# Patient Record
Sex: Female | Born: 1945 | Race: White | Hispanic: No | Marital: Married | Smoking: Never smoker
Health system: Southern US, Community
[De-identification: ages and names within clinical notes are randomized; demographics above are authoritative.]

## PROBLEM LIST (undated history)

## (undated) DIAGNOSIS — I77810 Thoracic aortic ectasia: Secondary | ICD-10-CM

## (undated) DIAGNOSIS — K900 Celiac disease: Secondary | ICD-10-CM

## (undated) DIAGNOSIS — I350 Nonrheumatic aortic (valve) stenosis: Secondary | ICD-10-CM

## (undated) DIAGNOSIS — K219 Gastro-esophageal reflux disease without esophagitis: Secondary | ICD-10-CM

## (undated) DIAGNOSIS — I77819 Aortic ectasia, unspecified site: Secondary | ICD-10-CM

## (undated) DIAGNOSIS — M1990 Unspecified osteoarthritis, unspecified site: Secondary | ICD-10-CM

## (undated) DIAGNOSIS — G4733 Obstructive sleep apnea (adult) (pediatric): Secondary | ICD-10-CM

## (undated) DIAGNOSIS — I5189 Other ill-defined heart diseases: Secondary | ICD-10-CM

## (undated) DIAGNOSIS — I10 Essential (primary) hypertension: Secondary | ICD-10-CM

## (undated) DIAGNOSIS — E039 Hypothyroidism, unspecified: Secondary | ICD-10-CM

## (undated) DIAGNOSIS — E785 Hyperlipidemia, unspecified: Secondary | ICD-10-CM

## (undated) HISTORY — DX: Hypothyroidism, unspecified: E03.9

## (undated) HISTORY — DX: Other ill-defined heart diseases: I51.89

## (undated) HISTORY — DX: Essential (primary) hypertension: I10

## (undated) HISTORY — DX: Thoracic aortic ectasia: I77.810

## (undated) HISTORY — DX: Aortic ectasia, unspecified site: I77.819

## (undated) HISTORY — DX: Obstructive sleep apnea (adult) (pediatric): G47.33

## (undated) HISTORY — DX: Nonrheumatic aortic (valve) stenosis: I35.0

## (undated) HISTORY — DX: Hyperlipidemia, unspecified: E78.5

## (undated) HISTORY — PX: OTHER SURGICAL HISTORY: SHX169

---

## 2009-04-03 ENCOUNTER — Emergency Department (HOSPITAL_COMMUNITY): Admission: EM | Admit: 2009-04-03 | Discharge: 2009-04-03 | Payer: Self-pay | Admitting: Family Medicine

## 2012-06-19 ENCOUNTER — Other Ambulatory Visit: Payer: Self-pay | Admitting: Family Medicine

## 2012-06-19 DIAGNOSIS — Z1231 Encounter for screening mammogram for malignant neoplasm of breast: Secondary | ICD-10-CM

## 2012-07-27 ENCOUNTER — Ambulatory Visit
Admission: RE | Admit: 2012-07-27 | Discharge: 2012-07-27 | Disposition: A | Discharge status: Home / Self Care | Payer: Medicare Other | Source: Ambulatory Visit | Attending: Family Medicine | Admitting: Family Medicine

## 2012-07-27 DIAGNOSIS — Z1231 Encounter for screening mammogram for malignant neoplasm of breast: Secondary | ICD-10-CM

## 2013-06-28 ENCOUNTER — Encounter: Payer: Self-pay | Admitting: General Surgery

## 2013-06-28 DIAGNOSIS — R0683 Snoring: Secondary | ICD-10-CM | POA: Insufficient documentation

## 2013-06-28 DIAGNOSIS — E039 Hypothyroidism, unspecified: Secondary | ICD-10-CM

## 2013-06-28 DIAGNOSIS — I10 Essential (primary) hypertension: Secondary | ICD-10-CM | POA: Insufficient documentation

## 2013-07-11 ENCOUNTER — Encounter: Payer: Self-pay | Admitting: Cardiology

## 2013-07-11 ENCOUNTER — Ambulatory Visit (INDEPENDENT_AMBULATORY_CARE_PROVIDER_SITE_OTHER): Payer: Medicare Other | Admitting: Cardiology

## 2013-07-11 VITALS — BP 120/86 | HR 85 | Ht 67.0 in | Wt 248.0 lb

## 2013-07-11 DIAGNOSIS — I10 Essential (primary) hypertension: Secondary | ICD-10-CM

## 2013-07-11 DIAGNOSIS — R0609 Other forms of dyspnea: Secondary | ICD-10-CM

## 2013-07-11 DIAGNOSIS — R0683 Snoring: Secondary | ICD-10-CM

## 2013-07-11 DIAGNOSIS — R0989 Other specified symptoms and signs involving the circulatory and respiratory systems: Secondary | ICD-10-CM

## 2013-07-11 NOTE — Progress Notes (Signed)
[place], [place], [place]  [address] Phone: [phone] Fax:  [phone]  Date:  07/11/2013   ID:  Tina Elliott, DOB 09/06/1945, MRN [id number]  PCP:  Gavin Pound, MD  Sleep Medicine:  Fransico Him, MD    History of Present Illness: Tina Elliott is a 68 y.o. female with a history of HTN who presents today for evaluation of symptoms concerning for OSA.  Apparently she told her PCP that she had been snoring.  Her husband complained to her that she was snoring a lot.  She feels rested when she gets up but has no daytime sleepiness until after dinner.  She goes to bed at 10:30pm and gets up around 7am.  She gets up 1-2 times nightly to go to the bathroom.  She denies any daytime headaches.  She denies any chest pain, SOB, DOE, LE edema, palpitations, dizziness or syncope.     Wt Readings from Last 3 Encounters:  07/11/13 248 lb (112.492 kg)  06/28/13 250 lb 6.4 oz (113.581 kg)     Past Medical History  Diagnosis Date  . Hypertension   . Hyperlipidemia   . Hypothyroidism     Current Outpatient Prescriptions  Medication Sig Dispense Refill  . aspirin 81 MG tablet Take 81 mg by mouth daily.      . Biotin 1 MG CAPS Take 1 tablet by mouth daily.      . calcium carbonate (OS-CAL) 600 MG TABS tablet Take 600 mg by mouth 2 (two) times daily with a meal.      . cholecalciferol (VITAMIN D) 1000 UNITS tablet Take 1,000 Units by mouth daily.      Marland Kitchen levothyroxine (SYNTHROID, LEVOTHROID) 100 MCG tablet Take 100 mcg by mouth daily before breakfast.      . lisinopril-hydrochlorothiazide (PRINZIDE,ZESTORETIC) 10-12.5 MG per tablet Take 1 tablet by mouth daily.      Marland Kitchen loratadine (CLARITIN) 10 MG tablet Take 10 mg by mouth daily.      . Multiple Vitamin (MULTIVITAMIN) tablet Take 1 tablet by mouth daily.      . Omega-3 Fatty Acids (FISH OIL) 1000 MG CAPS Take 2 capsules by mouth daily.       . pravastatin (PRAVACHOL) 40 MG tablet Take 40 mg by mouth daily.      . Verapamil HCl CR  300 MG CP24 Take 1 capsule by mouth daily.      . vitamin C (ASCORBIC ACID) 500 MG tablet Take 1,000 mg by mouth daily.       No current facility-administered medications for this visit.    Allergies:    Allergies  Allergen Reactions  . Wheat Bran Anaphylaxis  . Celebrex [Celecoxib] Other (See Comments)    GI issues  . Gluten Meal Other (See Comments)    Celiac disease    Social History:  The patient  reports that she has never smoked. She does not have any smokeless tobacco history on file. She reports that she does not drink alcohol or use illicit drugs.   Family History:  The patient's family history includes AAA (abdominal aortic aneurysm) in her brother; Heart attack in her father and mother.   ROS:  Please see the history of present illness.      All other systems reviewed and negative.   PHYSICAL EXAM: VS:  BP 120/86  Pulse 85  Ht 5' 7"  (1.702 m)  Wt 248 lb (112.492 kg)  BMI 38.83 kg/m2  SpO2 96% Well nourished, well  developed, in no acute distress HEENT: normal Neck: no JVD Cardiac:  normal S1, S2; RRR; no murmur Lungs:  clear to auscultation bilaterally, no wheezing, rhonchi or rales Abd: soft, nontender, no hepatomegaly Ext: no edema Skin: warm and dry Neuro:  CNs 2-12 intact, no focal abnormalities noted       ASSESSMENT AND PLAN:  1. Snoring with no daytime sleepiness - apparently her snoring is very loud.  - PSG to evaluate for sleep apnea 2. HTN - well controlled  - continue Prinizide/Verapamil  Followup with me PRN    Signed, Fransico Him, MD 07/11/2013 9:49 AM

## 2013-07-11 NOTE — Patient Instructions (Signed)
Your physician recommends that you continue on your current medications as directed. Please refer to the Current Medication list given to you today.  Your physician has recommended that you have a sleep study. This test records several body functions during sleep, including: brain activity, eye movement, oxygen and carbon dioxide blood levels, heart rate and rhythm, breathing rate and rhythm, the flow of air through your mouth and nose, snoring, body muscle movements, and chest and belly movement.([facility])  Your physician recommends that you schedule a follow-up appointment as needed

## 2013-07-23 ENCOUNTER — Telehealth: Payer: Self-pay | Admitting: Cardiology

## 2013-07-23 NOTE — Telephone Encounter (Signed)
Please let patient know that she has mild to moderate OSA with a total AHI of 9.58/hr but during REM sleep it is 30/hr.  Please set up for CPAP titration at [facility]

## 2013-07-24 NOTE — Telephone Encounter (Signed)
Pt is aware. CPAP titration faxed over for pt.

## 2013-07-25 ENCOUNTER — Other Ambulatory Visit: Payer: Self-pay

## 2013-07-25 DIAGNOSIS — Z1231 Encounter for screening mammogram for malignant neoplasm of breast: Secondary | ICD-10-CM

## 2013-08-01 ENCOUNTER — Telehealth: Payer: Self-pay | Admitting: Cardiology

## 2013-08-01 NOTE — Telephone Encounter (Signed)
Pt is scheduled for 10/10/13 at 1:15

## 2013-08-01 NOTE — Telephone Encounter (Signed)
Please let patient know that she had successful CPAP titration to 7cm H2O and set up OV with me in 10 weeks

## 2013-08-02 NOTE — Telephone Encounter (Signed)
Pt is aware.  

## 2013-08-14 ENCOUNTER — Ambulatory Visit
Admission: RE | Admit: 2013-08-14 | Discharge: 2013-08-14 | Disposition: A | Discharge status: Home / Self Care | Payer: Medicare Other | Source: Ambulatory Visit

## 2013-08-14 DIAGNOSIS — Z1231 Encounter for screening mammogram for malignant neoplasm of breast: Secondary | ICD-10-CM

## 2013-08-23 ENCOUNTER — Encounter: Payer: Self-pay | Admitting: Cardiology

## 2013-09-09 ENCOUNTER — Encounter: Payer: Self-pay | Admitting: Cardiology

## 2013-09-26 ENCOUNTER — Encounter: Payer: Self-pay | Admitting: Cardiology

## 2013-09-26 ENCOUNTER — Encounter: Payer: Self-pay | Admitting: General Surgery

## 2013-09-26 ENCOUNTER — Telehealth: Payer: Self-pay | Admitting: Cardiology

## 2013-09-26 NOTE — Telephone Encounter (Signed)
Letter sent to pt to make aware.  

## 2013-09-26 NOTE — Telephone Encounter (Signed)
Please let patient know that CPAP download was normal with good compliance and AHI

## 2013-10-10 ENCOUNTER — Ambulatory Visit (INDEPENDENT_AMBULATORY_CARE_PROVIDER_SITE_OTHER): Payer: Medicare Other | Admitting: Cardiology

## 2013-10-10 ENCOUNTER — Encounter: Payer: Self-pay | Admitting: Cardiology

## 2013-10-10 VITALS — BP 140/80 | HR 76 | Ht 67.0 in | Wt 247.0 lb

## 2013-10-10 DIAGNOSIS — R011 Cardiac murmur, unspecified: Secondary | ICD-10-CM

## 2013-10-10 DIAGNOSIS — G4733 Obstructive sleep apnea (adult) (pediatric): Secondary | ICD-10-CM | POA: Insufficient documentation

## 2013-10-10 DIAGNOSIS — I10 Essential (primary) hypertension: Secondary | ICD-10-CM

## 2013-10-10 NOTE — Patient Instructions (Signed)
Your physician recommends that you continue on your current medications as directed. Please refer to the Current Medication list given to you today.  Your physician has requested that you have an echocardiogram. Echocardiography is a painless test that uses sound waves to create images of your heart. It provides your doctor with information about the size and shape of your heart and how well your heart's chambers and valves are working. This procedure takes approximately one hour. There are no restrictions for this procedure.  Your physician wants you to follow-up in: 6 months with Dr Turner You will receive a reminder letter in the mail two months in advance. If you don't receive a letter, please call our office to schedule the follow-up appointment.  

## 2013-10-10 NOTE — Progress Notes (Signed)
[place], [place], [place]  [address] Phone: [phone] Fax:  [phone]  Date:  10/10/2013   ID:  Tina Elliott, DOB February 19, 1946, MRN [id number]  PCP:  Gavin Pound, MD  Sleep Medicine:  Fransico Him, MD   History of Present Illness: Tina Elliott is a 68 y.o. female with a history of HTN who presented today for evaluation of OSA.  She recently underwent a PSG which showed sleep apnea with an AHI of 9.58/hr and underwent CPAP titration to 7cm H2O.  She is doing well with her CPAP therapy.  She tolerates her nasal mask and feels the pressure is adequate.  She feels rested in the am and has no daytime sleepiness.  Since going on her CPAP she feels better during the day with her energy level.   Wt Readings from Last 3 Encounters:  10/10/13 247 lb (112.038 kg)  07/11/13 248 lb (112.492 kg)  06/28/13 250 lb 6.4 oz (113.581 kg)     Past Medical History  Diagnosis Date  . Hypertension   . Hyperlipidemia   . Hypothyroidism   . OSA (obstructive sleep apnea)     mild with AHI 9.58/hr on CPAP at 7cm H2O    Current Outpatient Prescriptions  Medication Sig Dispense Refill  . aspirin 81 MG tablet Take 81 mg by mouth daily.      . Biotin 1 MG CAPS Take 1 tablet by mouth daily.      . calcium carbonate (OS-CAL) 600 MG TABS tablet Take 600 mg by mouth 2 (two) times daily with a meal.      . cholecalciferol (VITAMIN D) 1000 UNITS tablet Take 1,000 Units by mouth daily.      Marland Kitchen levothyroxine (SYNTHROID, LEVOTHROID) 100 MCG tablet Take 100 mcg by mouth daily before breakfast.      . lisinopril-hydrochlorothiazide (PRINZIDE,ZESTORETIC) 10-12.5 MG per tablet Take 1 tablet by mouth daily.      Marland Kitchen loratadine (CLARITIN) 10 MG tablet Take 10 mg by mouth daily.      . Multiple Vitamin (MULTIVITAMIN) tablet Take 1 tablet by mouth daily.      . Omega-3 Fatty Acids (FISH OIL) 1000 MG CAPS Take 2 capsules by mouth daily.       . pravastatin (PRAVACHOL) 40 MG tablet Take 40 mg by mouth daily.       . Verapamil HCl CR 300 MG CP24 Take 1 capsule by mouth daily.      . vitamin C (ASCORBIC ACID) 500 MG tablet Take 1,000 mg by mouth daily.       No current facility-administered medications for this visit.    Allergies:    Allergies  Allergen Reactions  . Wheat Bran Anaphylaxis  . Celebrex [Celecoxib] Other (See Comments)    GI issues  . Gluten Meal Other (See Comments)    Celiac disease    Social History:  The patient  reports that she has never smoked. She does not have any smokeless tobacco history on file. She reports that she does not drink alcohol or use illicit drugs.   Family History:  The patient's family history includes AAA (abdominal aortic aneurysm) in her brother; Heart attack in her father and mother.   ROS:  Please see the history of present illness.      All other systems reviewed and negative.   PHYSICAL EXAM: VS:  BP 140/80  Pulse 76  Ht 5' 7"  (1.702 m)  Wt 247 lb (112.038 kg)  BMI 38.68  kg/m2 Well nourished, well developed, in no acute distress HEENT: normal Neck: no JVD Cardiac:  normal S1, S2; RRR; 2/6 SM murmur at RUSB Lungs:  clear to auscultation bilaterally, no wheezing, rhonchi or rales Abd: soft, nontender, no hepatomegaly Ext: no edema Skin: warm and dry Neuro:  CNs 2-12 intact, no focal abnormalities noted       ASSESSMENT AND PLAN:  1. OSA on CPAP and tolerating well.  Her last d/l showed an ahi of 1.2/hr and 100% compliance in using more than 4 hours nightly. 2. HTN well controlled - continue Prinizide/Verapamil 3.  Heart murmur -check 2D echo to evaulate  Followup with me in 6 months  Signed, Fransico Him, MD 10/10/2013 1:33 PM

## 2013-10-22 ENCOUNTER — Ambulatory Visit (HOSPITAL_COMMUNITY): Payer: Medicare Other | Attending: Cardiology | Admitting: Radiology

## 2013-10-22 DIAGNOSIS — R011 Cardiac murmur, unspecified: Secondary | ICD-10-CM

## 2013-10-22 NOTE — Progress Notes (Signed)
Echocardiogram Performed. 

## 2014-02-08 ENCOUNTER — Encounter: Payer: Self-pay | Admitting: Cardiology

## 2014-03-11 ENCOUNTER — Ambulatory Visit: Payer: Medicare Other | Admitting: Cardiology

## 2014-04-07 ENCOUNTER — Encounter: Payer: Self-pay | Admitting: Cardiology

## 2014-04-07 ENCOUNTER — Encounter (HOSPITAL_COMMUNITY): Payer: Self-pay | Admitting: Emergency Medicine

## 2014-04-07 ENCOUNTER — Emergency Department (HOSPITAL_COMMUNITY)
Admission: EM | Admit: 2014-04-07 | Discharge: 2014-04-08 | Disposition: A | Discharge status: Home / Self Care | Payer: Medicare Other | Attending: Emergency Medicine | Admitting: Emergency Medicine

## 2014-04-07 ENCOUNTER — Ambulatory Visit (INDEPENDENT_AMBULATORY_CARE_PROVIDER_SITE_OTHER): Payer: Medicare Other | Admitting: Cardiology

## 2014-04-07 VITALS — BP 150/90 | HR 87 | Ht 67.0 in | Wt 248.0 lb

## 2014-04-07 DIAGNOSIS — E785 Hyperlipidemia, unspecified: Secondary | ICD-10-CM | POA: Insufficient documentation

## 2014-04-07 DIAGNOSIS — I10 Essential (primary) hypertension: Secondary | ICD-10-CM

## 2014-04-07 DIAGNOSIS — G4733 Obstructive sleep apnea (adult) (pediatric): Secondary | ICD-10-CM

## 2014-04-07 DIAGNOSIS — Z8719 Personal history of other diseases of the digestive system: Secondary | ICD-10-CM | POA: Insufficient documentation

## 2014-04-07 DIAGNOSIS — E039 Hypothyroidism, unspecified: Secondary | ICD-10-CM | POA: Insufficient documentation

## 2014-04-07 DIAGNOSIS — I5189 Other ill-defined heart diseases: Secondary | ICD-10-CM | POA: Insufficient documentation

## 2014-04-07 DIAGNOSIS — Z7982 Long term (current) use of aspirin: Secondary | ICD-10-CM | POA: Diagnosis not present

## 2014-04-07 DIAGNOSIS — M79605 Pain in left leg: Secondary | ICD-10-CM | POA: Insufficient documentation

## 2014-04-07 DIAGNOSIS — Z79899 Other long term (current) drug therapy: Secondary | ICD-10-CM | POA: Insufficient documentation

## 2014-04-07 DIAGNOSIS — M79662 Pain in left lower leg: Secondary | ICD-10-CM

## 2014-04-07 HISTORY — DX: Celiac disease: K90.0

## 2014-04-07 LAB — COMPREHENSIVE METABOLIC PANEL
ALT: 34 U/L (ref 0–35)
AST: 30 U/L (ref 0–37)
Albumin: 3.6 g/dL (ref 3.5–5.2)
Alkaline Phosphatase: 71 U/L (ref 39–117)
Anion gap: 14 (ref 5–15)
BUN: 22 mg/dL (ref 6–23)
CO2: 23 mEq/L (ref 19–32)
Calcium: 9.7 mg/dL (ref 8.4–10.5)
Chloride: 102 mEq/L (ref 96–112)
Creatinine, Ser: 1.29 mg/dL — ABNORMAL HIGH (ref 0.50–1.10)
GFR calc Af Amer: 47 mL/min — ABNORMAL LOW (ref >90)
GFR calc non Af Amer: 41 mL/min — ABNORMAL LOW (ref >90)
Glucose, Bld: 109 mg/dL — ABNORMAL HIGH (ref 70–99)
Potassium: 4.6 mEq/L (ref 3.7–5.3)
Sodium: 139 mEq/L (ref 137–147)
Total Bilirubin: 1 mg/dL (ref 0.3–1.2)
Total Protein: 7.4 g/dL (ref 6.0–8.3)

## 2014-04-07 LAB — CBC WITH DIFFERENTIAL/PLATELET
Basophils Absolute: 0.1 10*3/uL (ref 0.0–0.1)
Basophils Relative: 1 % (ref 0–1)
Eosinophils Absolute: 0.3 10*3/uL (ref 0.0–0.7)
Eosinophils Relative: 3 % (ref 0–5)
HCT: 45.7 % (ref 36.0–46.0)
Hemoglobin: 15.7 g/dL — ABNORMAL HIGH (ref 12.0–15.0)
Lymphocytes Relative: 25 % (ref 12–46)
Lymphs Abs: 2.4 10*3/uL (ref 0.7–4.0)
MCH: 31.7 pg (ref 26.0–34.0)
MCHC: 34.4 g/dL (ref 30.0–36.0)
MCV: 92.3 fL (ref 78.0–100.0)
Monocytes Absolute: 1 10*3/uL (ref 0.1–1.0)
Monocytes Relative: 10 % (ref 3–12)
Neutro Abs: 5.7 10*3/uL (ref 1.7–7.7)
Neutrophils Relative %: 61 % (ref 43–77)
Platelets: 202 10*3/uL (ref 150–400)
RBC: 4.95 MIL/uL (ref 3.87–5.11)
RDW: 13.8 % (ref 11.5–15.5)
WBC: 9.4 10*3/uL (ref 4.0–10.5)

## 2014-04-07 LAB — PROTIME-INR
INR: 0.91 (ref 0.00–1.49)
Prothrombin Time: 12.3 seconds (ref 11.6–15.2)

## 2014-04-07 LAB — D-DIMER, QUANTITATIVE: D-Dimer, Quant: 3.3 ug/mL-FEU — ABNORMAL HIGH (ref 0.00–0.48)

## 2014-04-07 MED ORDER — ENOXAPARIN SODIUM 120 MG/0.8ML ~~LOC~~ SOLN
110.0000 mg | Freq: Once | SUBCUTANEOUS | Status: AC
  Administered 2014-04-08: 110 mg via SUBCUTANEOUS
  Filled 2014-04-07: qty 2

## 2014-04-07 NOTE — ED Notes (Signed)
Pt. reports left calf pain for 2 days , denies injury or fall , seen [facility] today sent here for further evaluation- ? blood clot . Pedal pulse present / respirations unlabored .

## 2014-04-07 NOTE — Patient Instructions (Signed)
Your physician recommends that you continue on your current medications as directed. Please refer to the Current Medication list given to you today.  Your physician wants you to follow-up in: 6 months with Dr Turner You will receive a reminder letter in the mail two months in advance. If you don't receive a letter, please call our office to schedule the follow-up appointment.  

## 2014-04-07 NOTE — Progress Notes (Signed)
Tina Elliott, [place] Elliott, [place]  [address] Phone: [phone] Fax:  [phone]  Date:  04/07/2014   ID:  Tina Elliott, DOB 1946-03-09, MRN [id number]  PCP:  Gavin Pound, MD  Sleep medicine:  Fransico Him, MD    History of Present Illness: Tina Elliott is a 68 y.o. female with a history of HTN who presented today for followup of OSA. She has mild OSA and is on CPAP at 7cm H2O. She is doing well with her CPAP therapy. She tolerates her nasal mask and feels the pressure is adequate. She feels rested in the am and has no daytime sleepiness. Since going on her CPAP she feels better during the day with her energy level.  She has not had any problems with nasal congestion. She was snoring which resolved with a chin strap.     Wt Readings from Last 3 Encounters:  04/07/14 248 lb (112.492 kg)  10/10/13 247 lb (112.038 kg)  07/11/13 248 lb (112.492 kg)     Past Medical History  Diagnosis Date  . Hypertension   . Hyperlipidemia   . Hypothyroidism   . OSA (obstructive sleep apnea)     mild with AHI 9.58/hr on CPAP at 7cm H2O  . Diastolic dysfunction     Current Outpatient Prescriptions  Medication Sig Dispense Refill  . aspirin 81 MG tablet Take 81 mg by mouth daily.      . Biotin 1 MG CAPS Take 2 tablets by mouth daily.       . calcium carbonate (OS-CAL) 600 MG TABS tablet Take 600 mg by mouth 2 (two) times daily with a meal.      . cholecalciferol (VITAMIN D) 1000 UNITS tablet Take 1,000 Units by mouth daily.      Marland Kitchen levothyroxine (SYNTHROID, LEVOTHROID) 100 MCG tablet Take 100 mcg by mouth daily before breakfast.      . lisinopril-hydrochlorothiazide (PRINZIDE,ZESTORETIC) 10-12.5 MG per tablet Take 1 tablet by mouth daily.      Marland Kitchen loratadine (CLARITIN) 10 MG tablet Take 10 mg by mouth daily.      . Multiple Vitamin (MULTIVITAMIN) tablet Take 1 tablet by mouth daily.      . NON FORMULARY CPAP MACHINE      . Omega-3 Fatty Acids (FISH OIL) 1000 MG CAPS Take 2 capsules by  mouth daily.       . pravastatin (PRAVACHOL) 40 MG tablet Take 40 mg by mouth daily.      . Verapamil HCl CR 300 MG CP24 Take 1 capsule by mouth daily.      . vitamin C (ASCORBIC ACID) 500 MG tablet Take 1,000 mg by mouth daily.       No current facility-administered medications for this visit.    Allergies:    Allergies  Allergen Reactions  . Wheat Bran Anaphylaxis  . Celebrex [Celecoxib] Other (See Comments)    GI issues  . Gluten Meal Other (See Comments)    Celiac disease    Social History:  The patient  reports that she has never smoked. She does not have any smokeless tobacco history on file. She reports that she does not drink alcohol or use illicit drugs.   Family History:  The patient's family history includes AAA (abdominal aortic aneurysm) in her brother; Heart attack in her father and mother.   ROS:  Please see the history of present illness.      All other systems reviewed and negative.   PHYSICAL EXAM: VS:  BP 150/90  Pulse 87  Ht 5' 7"  (1.702 m)  Wt 248 lb (112.492 kg)  BMI 38.83 kg/m2  SpO2 94% Well nourished, well developed, in no acute distress HEENT: normal Neck: no JVD Cardiac:  normal S1, S2; RRR; no murmur Lungs:  clear to auscultation bilaterally, no wheezing, rhonchi or rales Abd: soft, nontender, no hepatomegaly Ext: no edema Skin: warm and dry Neuro:  CNs 2-12 intact, no focal abnormalities noted      ASSESSMENT AND PLAN:  1.  OSA on CPAP and tolerating well.  - Her AHI today is 0.4/hr on 7cm H2O and 100% compliance in using more than 4 hours nightly 2.  HTN mildly elevated but has been having some leg pain and is seeing her PCP this afternoon - continue Prinizide/Verapamil  3.  Obesity - I have encouraged her to try to get more exercise once her leg pain resolves. I have encouraged her to try to increase her walking time to an hour daily.  Followup with me in 6 months   Signed, Fransico Him, MD [facility] 04/07/2014 11:21 AM

## 2014-04-08 ENCOUNTER — Ambulatory Visit (HOSPITAL_COMMUNITY)
Admission: RE | Admit: 2014-04-08 | Discharge: 2014-04-08 | Disposition: A | Discharge status: Home / Self Care | Payer: Medicare Other | Source: Ambulatory Visit | Attending: Emergency Medicine | Admitting: Emergency Medicine

## 2014-04-08 ENCOUNTER — Encounter (HOSPITAL_COMMUNITY): Payer: Self-pay | Admitting: Emergency Medicine

## 2014-04-08 ENCOUNTER — Emergency Department (HOSPITAL_COMMUNITY)
Admission: EM | Admit: 2014-04-08 | Discharge: 2014-04-08 | Disposition: A | Discharge status: Home / Self Care | Payer: Medicare Other | Source: Home / Self Care | Attending: Emergency Medicine | Admitting: Emergency Medicine

## 2014-04-08 DIAGNOSIS — E039 Hypothyroidism, unspecified: Secondary | ICD-10-CM | POA: Insufficient documentation

## 2014-04-08 DIAGNOSIS — Z7902 Long term (current) use of antithrombotics/antiplatelets: Secondary | ICD-10-CM

## 2014-04-08 DIAGNOSIS — G4733 Obstructive sleep apnea (adult) (pediatric): Secondary | ICD-10-CM | POA: Insufficient documentation

## 2014-04-08 DIAGNOSIS — E785 Hyperlipidemia, unspecified: Secondary | ICD-10-CM | POA: Insufficient documentation

## 2014-04-08 DIAGNOSIS — I82442 Acute embolism and thrombosis of left tibial vein: Secondary | ICD-10-CM | POA: Diagnosis not present

## 2014-04-08 DIAGNOSIS — Z8719 Personal history of other diseases of the digestive system: Secondary | ICD-10-CM

## 2014-04-08 DIAGNOSIS — I824Z2 Acute embolism and thrombosis of unspecified deep veins of left distal lower extremity: Secondary | ICD-10-CM | POA: Insufficient documentation

## 2014-04-08 DIAGNOSIS — Z79899 Other long term (current) drug therapy: Secondary | ICD-10-CM

## 2014-04-08 DIAGNOSIS — I82402 Acute embolism and thrombosis of unspecified deep veins of left lower extremity: Secondary | ICD-10-CM

## 2014-04-08 DIAGNOSIS — I10 Essential (primary) hypertension: Secondary | ICD-10-CM | POA: Insufficient documentation

## 2014-04-08 DIAGNOSIS — M79605 Pain in left leg: Secondary | ICD-10-CM | POA: Diagnosis present

## 2014-04-08 DIAGNOSIS — M79609 Pain in unspecified limb: Secondary | ICD-10-CM

## 2014-04-08 DIAGNOSIS — I82492 Acute embolism and thrombosis of other specified deep vein of left lower extremity: Secondary | ICD-10-CM | POA: Insufficient documentation

## 2014-04-08 MED ORDER — ONDANSETRON 4 MG PO TBDP: 4.0000 mg | ORAL_TABLET | ORAL | Status: DC | PRN

## 2014-04-08 MED ORDER — APIXABAN 5 MG PO TABS: 10.0000 mg | ORAL_TABLET | Freq: Two times a day (BID) | ORAL | Status: DC

## 2014-04-08 MED ORDER — DOCUSATE SODIUM 100 MG PO CAPS: 100.0000 mg | ORAL_CAPSULE | Freq: Every day | ORAL | Status: DC

## 2014-04-08 MED ORDER — HYDROCODONE-ACETAMINOPHEN 5-325 MG PO TABS: 1.0000 | ORAL_TABLET | Freq: Four times a day (QID) | ORAL | Status: DC | PRN

## 2014-04-08 MED ORDER — APIXABAN 5 MG PO TABS
10.0000 mg | ORAL_TABLET | Freq: Once | ORAL | Status: AC
  Administered 2014-04-08: 10 mg via ORAL
  Filled 2014-04-08: qty 2

## 2014-04-08 NOTE — ED Provider Notes (Signed)
CSN: [id number]     Arrival date & time 04/07/14  2047 History   First MD Initiated Contact with Patient 04/07/14 2332     Chief Complaint  Patient presents with  . Leg Pain     (Consider location/radiation/quality/duration/timing/severity/associated sxs/prior Treatment) HPI  68 year old female with atraumatic left calf pain. First noticed while she was getting out of bed on Saturday. Pain has been relatively constant since. No appreciable swelling. No rash. No history similar symptoms. Denies any history of DVT/pulmonary embolism. No chest pain or shortness of breath. Reports that she was evaluated by her PCP earlier today and referred to the emergency room for ultrasonography over concern for possible DVT. Labs drawn prior to my evaluation. D-dimer is significantly elevated.  Past Medical History  Diagnosis Date  . Hypertension   . Hyperlipidemia   . Hypothyroidism   . OSA (obstructive sleep apnea)     mild with AHI 9.58/hr on CPAP at 7cm H2O  . Diastolic dysfunction   . Celiac disease    Past Surgical History  Procedure Laterality Date  . Left knee sx     Family History  Problem Relation Age of Onset  . Heart attack Mother   . Heart attack Father   . AAA (abdominal aortic aneurysm) Brother    History  Substance Use Topics  . Smoking status: Never Smoker   . Smokeless tobacco: Not on file  . Alcohol Use: No   OB History   Grav Para Term Preterm Abortions TAB SAB Ect Mult Living                 Review of Systems  All systems reviewed and negative, other than as noted in HPI.   Allergies  Wheat bran; Celebrex; and Gluten meal  Home Medications   Prior to Admission medications   Medication Sig Start Date End Date Taking? Authorizing Provider  aspirin 81 MG tablet Take 81 mg by mouth daily.    Historical Provider, MD  Biotin 1 MG CAPS Take 2 tablets by mouth daily.     Historical Provider, MD  calcium carbonate (OS-CAL) 600 MG TABS tablet Take 600 mg by mouth  2 (two) times daily with a meal.    Historical Provider, MD  cholecalciferol (VITAMIN D) 1000 UNITS tablet Take 1,000 Units by mouth daily.    Historical Provider, MD  levothyroxine (SYNTHROID, LEVOTHROID) 100 MCG tablet Take 100 mcg by mouth daily before breakfast.    Historical Provider, MD  lisinopril-hydrochlorothiazide (PRINZIDE,ZESTORETIC) 10-12.5 MG per tablet Take 1 tablet by mouth daily.    Historical Provider, MD  loratadine (CLARITIN) 10 MG tablet Take 10 mg by mouth daily.    Historical Provider, MD  Multiple Vitamin (MULTIVITAMIN) tablet Take 1 tablet by mouth daily.    Historical Provider, MD  NON FORMULARY CPAP MACHINE    Historical Provider, MD  Omega-3 Fatty Acids (FISH OIL) 1000 MG CAPS Take 2 capsules by mouth daily.     Historical Provider, MD  pravastatin (PRAVACHOL) 40 MG tablet Take 40 mg by mouth daily.    Historical Provider, MD  Verapamil HCl CR 300 MG CP24 Take 1 capsule by mouth daily.    Historical Provider, MD  vitamin C (ASCORBIC ACID) 500 MG tablet Take 1,000 mg by mouth daily.    Historical Provider, MD   BP 137/73  Pulse 97  Temp(Src) 97.9 F (36.6 C) (Oral)  Resp 16  Ht 5' 7"  (1.702 m)  Wt 247 lb 6.4 oz (112.22  kg)  BMI 38.74 kg/m2  SpO2 96% Physical Exam  Nursing note and vitals reviewed. Constitutional: She appears well-developed and well-nourished. No distress.  HENT:  Head: Normocephalic and atraumatic.  Eyes: Conjunctivae are normal. Right eye exhibits no discharge. Left eye exhibits no discharge.  Neck: Neck supple.  Cardiovascular: Normal rate, regular rhythm and normal heart sounds.  Exam reveals no gallop and no friction rub.   No murmur heard. Pulmonary/Chest: Effort normal and breath sounds normal. No respiratory distress.  Abdominal: Soft. She exhibits no distension. There is no tenderness.  Musculoskeletal: She exhibits tenderness. She exhibits no edema.  Tenderness to left calf. Perhaps mild swelling as compared to the right. No  palpable cords. Negative Homans. Easily palpable DP pulse. Neurologically intact distally.  Neurological: She is alert.  Skin: Skin is warm and dry.  Psychiatric: She has a normal mood and affect. Her behavior is normal. Thought content normal.    ED Course  Procedures (including critical care time) Labs Review Labs Reviewed  CBC WITH DIFFERENTIAL - Abnormal; Notable for the following:    Hemoglobin 15.7 (*)    All other components within normal limits  COMPREHENSIVE METABOLIC PANEL - Abnormal; Notable for the following:    Glucose, Bld 109 (*)    Creatinine, Ser 1.29 (*)    GFR calc non Af Amer 41 (*)    GFR calc Af Amer 47 (*)    All other components within normal limits  D-DIMER, QUANTITATIVE - Abnormal; Notable for the following:    D-Dimer, Quant 3.30 (*)    All other components within normal limits  PROTIME-INR    Imaging Review No results found.   EKG Interpretation None      MDM   Final diagnoses:  Calf pain, left    68 year old female with atraumatic left calf pain. I to have concern for possible DVT. No symptoms concerning for PE.  I do not have a good alternative explanation for her symptoms. D-dimer is elevated. Unfortunately, cannot obtain ultrasound at this time of night. Will arrange for her to return tomorrow to obtain this study. Will empirically treat with a dose of Lovenox.    Virgel Manifold, MD 04/08/14 [phone]

## 2014-04-08 NOTE — ED Notes (Signed)
Pt had study completed today which confirmed a LLE DVT.  Pt sent here for treatment.

## 2014-04-08 NOTE — Progress Notes (Signed)
*  Preliminary Results* Left lower extremity venous duplex completed. Left lower extremity is positive for deep vein thrombosis involving the proximal to mid segments of the left posterior tibial and peroneal veins. There is no evidence of left Baker's cyst.  Preliminary results have been discussed with Dr.Nnodi and the patient has been instructed to go to the emergency department for treatment.  04/08/2014 9:00 AM  Gertie FeyMichelle Berenis Corter, RVT, RDCS, RDMS

## 2014-04-08 NOTE — Discharge Instructions (Signed)

## 2014-04-08 NOTE — ED Provider Notes (Signed)
CSN: [id number]     Arrival date & time 04/08/14  0912 History   First MD Initiated Contact with Patient 04/08/14 [phone]     Chief Complaint  Patient presents with  . DVT     (Consider location/radiation/quality/duration/timing/severity/associated sxs/prior Treatment) HPI Patient positive for lower extremity DVT today. Sent to ED for anticoagulation. No associated trauma. Started with calf pain without significant swelling. Patient denies any CP/SOB/LIGHTNEADENESS or syncope. No swelling change since onset, pain persists. No fever/ chills or malaise.   No GI  bleeding history, no CVA or hemorrhagic stroke history. No apparent significant DVT risk factors except obesity with comorbid HTN/dyslipedemia/diastolyic dysfunction.  Past Medical History  Diagnosis Date  . Hypertension   . Hyperlipidemia   . Hypothyroidism   . OSA (obstructive sleep apnea)     mild with AHI 9.58/hr on CPAP at 7cm H2O  . Diastolic dysfunction   . Celiac disease    Past Surgical History  Procedure Laterality Date  . Left knee sx     Family History  Problem Relation Age of Onset  . Heart attack Mother   . Heart attack Father   . AAA (abdominal aortic aneurysm) Brother    History  Substance Use Topics  . Smoking status: Never Smoker   . Smokeless tobacco: Not on file  . Alcohol Use: No   OB History   Grav Para Term Preterm Abortions TAB SAB Ect Mult Living                 Review of Systems  10 Systems reviewed and are negative for acute change except as noted in the HPI.  Allergies  Wheat bran; Celebrex; and Gluten meal  Home Medications   Prior to Admission medications   Medication Sig Start Date End Date Taking? Authorizing Provider  Biotin 1 MG CAPS Take 2 tablets by mouth daily.    Yes Historical Provider, MD  calcium carbonate (OS-CAL) 600 MG TABS tablet Take 600 mg by mouth daily with breakfast.    Yes Historical Provider, MD  cholecalciferol (VITAMIN D) 1000 UNITS tablet Take 1,000  Units by mouth daily.   Yes Historical Provider, MD  levothyroxine (SYNTHROID, LEVOTHROID) 100 MCG tablet Take 100 mcg by mouth daily before breakfast.   Yes Historical Provider, MD  lisinopril-hydrochlorothiazide (PRINZIDE,ZESTORETIC) 10-12.5 MG per tablet Take 1 tablet by mouth daily.   Yes Historical Provider, MD  loratadine (CLARITIN) 10 MG tablet Take 10 mg by mouth daily.   Yes Historical Provider, MD  Multiple Vitamin (MULTIVITAMIN) tablet Take 1 tablet by mouth daily.   Yes Historical Provider, MD  NON FORMULARY CPAP MACHINE   Yes Historical Provider, MD  Omega-3 Fatty Acids (FISH OIL) 1000 MG CAPS Take 2 capsules by mouth daily.    Yes Historical Provider, MD  pravastatin (PRAVACHOL) 40 MG tablet Take 40 mg by mouth daily.   Yes Historical Provider, MD  Verapamil HCl CR 300 MG CP24 Take 300 mg by mouth daily.    Yes Historical Provider, MD  vitamin C (ASCORBIC ACID) 500 MG tablet Take 500 mg by mouth daily.    Yes Historical Provider, MD  apixaban (ELIQUIS) 5 MG TABS tablet Take 2 tablets (10 mg total) by mouth 2 (two) times daily. Take 2 tablets by mouth twice daily for 7 days, then one tablet twice daily. 04/08/14   Charlesetta Shanks, MD  docusate sodium (COLACE) 100 MG capsule Take 1 capsule (100 mg total) by mouth daily. 04/08/14   Charlesetta Shanks,  MD  HYDROcodone-acetaminophen (NORCO/VICODIN) 5-325 MG per tablet Take 1-2 tablets by mouth every 6 (six) hours as needed for moderate pain or severe pain. 04/08/14   Charlesetta Shanks, MD  ondansetron (ZOFRAN ODT) 4 MG disintegrating tablet Take 1 tablet (4 mg total) by mouth every 4 (four) hours as needed for nausea or vomiting. 04/08/14   Charlesetta Shanks, MD   BP 130/61  Pulse 72  Temp(Src) 97.8 F (36.6 C) (Oral)  Resp 16  Ht 5' 7"  (1.702 m)  Wt 247 lb 6 oz (112.209 kg)  BMI 38.74 kg/m2  SpO2 98% Physical Exam  Constitutional: She is oriented to person, place, and time.  Well-appearing 68 year old female no acute distress. Obesity. No  respiratory distress.  HENT:  Head: Normocephalic and atraumatic.  Eyes: EOM are normal.  Cardiovascular: Normal rate, regular rhythm, normal heart sounds and intact distal pulses.   Pulmonary/Chest: Effort normal and breath sounds normal. No respiratory distress. She has no wheezes. She has no rales.  Abdominal: Soft. There is no tenderness.  Musculoskeletal:  Bilateral lower extremities are symmetric in appearance. There is no significant associated edema. Both dorsalis pedis pulses are 2+ and symmetric. Refills brisk less than 2 seconds. Patient significant tenderness to palpation of the calf on the left. No redness or streaking.  Neurological: She is alert and oriented to person, place, and time.  Skin: Skin is warm and dry.  Psychiatric: She has a normal mood and affect.    ED Course  Procedures (including critical care time) Labs Review Labs Reviewed - No data to display  Imaging Review No results found.   EKG Interpretation None      MDM   Final diagnoses:  DVT (deep venous thrombosis), left   Patient presents with confirmed DVT in the posterior tibial and peroneal veins. At this point with distal DVT that is symptomatic with pain but no swelling and no cardiovascular symptoms, limited risk factor for anticoagulation of age and obesity (but not immobilized or limited in capacity to ambulate) patient will be started on outpatient oral anticoagulation with close PCP follow up. Patient counseled to d/c ASA and any NSAID as well as dietary supplements not specifically advised to continue by PCP. Patient counseled on bleeding risks and necessity to return if she should develop any chest pain shortness of breath or other concerning symptoms.    Charlesetta Shanks, MD 04/08/14 1136

## 2014-04-11 ENCOUNTER — Encounter: Payer: Self-pay | Admitting: Cardiology

## 2014-04-11 ENCOUNTER — Ambulatory Visit: Payer: Medicare Other | Admitting: Cardiology

## 2014-07-14 ENCOUNTER — Other Ambulatory Visit: Payer: Self-pay

## 2014-07-14 DIAGNOSIS — Z1231 Encounter for screening mammogram for malignant neoplasm of breast: Secondary | ICD-10-CM

## 2014-07-23 ENCOUNTER — Encounter: Payer: Self-pay | Admitting: Cardiology

## 2014-08-18 ENCOUNTER — Ambulatory Visit: Payer: Medicare Other

## 2014-08-26 ENCOUNTER — Ambulatory Visit
Admission: RE | Admit: 2014-08-26 | Discharge: 2014-08-26 | Disposition: A | Discharge status: Home / Self Care | Payer: Medicare Other | Source: Ambulatory Visit

## 2014-08-26 DIAGNOSIS — Z1231 Encounter for screening mammogram for malignant neoplasm of breast: Secondary | ICD-10-CM

## 2014-10-13 DIAGNOSIS — E669 Obesity, unspecified: Secondary | ICD-10-CM | POA: Insufficient documentation

## 2014-10-13 NOTE — Progress Notes (Signed)
Cardiology Office Note   Date:  10/14/2014   ID:  Tina Elliott, DOB Feb 08, 1946, MRN [id number]  PCP:  Tina Pound, MD    Chief Complaint  Patient presents with  . Sleep Apnea  . Obesity  . Hypertension      History of Present Illness: Tina Elliott is a 69 y.o. female with a history of HTN who presented today for followup of OSA. She has mild OSA and is on CPAP at 7cm H2O. She is doing well with her CPAP therapy. She tolerates her nasal mask and feels the pressure is adequate. She feels rested in the am and has no daytime sleepiness. Since going on her CPAP she feels better during the day with her energy level. She has not had any problems with nasal congestion.  She occasionally has some dryness in her nose.   She was snoring which resolved with a chin strap.      Past Medical History  Diagnosis Date  . Hypertension   . Hyperlipidemia   . Hypothyroidism   . OSA (obstructive sleep apnea)     mild with AHI 9.58/hr on CPAP at 7cm H2O  . Diastolic dysfunction   . Celiac disease     Past Surgical History  Procedure Laterality Date  . Left knee sx       Current Outpatient Prescriptions  Medication Sig Dispense Refill  . BIOTIN 5000 PO Take 2 tablets by mouth daily.    . calcium carbonate (OS-CAL) 600 MG TABS tablet Take 600 mg by mouth daily with breakfast.     . cholecalciferol (VITAMIN D) 1000 UNITS tablet Take 1,000 Units by mouth daily.    Marland Kitchen levothyroxine (SYNTHROID, LEVOTHROID) 100 MCG tablet Take 100 mcg by mouth daily before breakfast.    . lisinopril-hydrochlorothiazide (PRINZIDE,ZESTORETIC) 10-12.5 MG per tablet Take 1 tablet by mouth daily.    Marland Kitchen loratadine (CLARITIN) 10 MG tablet Take 10 mg by mouth daily.    . Multiple Vitamin (MULTIVITAMIN) tablet Take 1 tablet by mouth daily.    . NON FORMULARY CPAP MACHINE    . Omega-3 Fatty Acids (FISH OIL) 1000 MG CAPS Take 1 capsule by mouth daily.     . pravastatin (PRAVACHOL) 40 MG tablet Take 40 mg by mouth  daily.    . Verapamil HCl CR 300 MG CP24 Take 300 mg by mouth daily.     . vitamin C (ASCORBIC ACID) 500 MG tablet Take 500 mg by mouth daily.      No current facility-administered medications for this visit.    Allergies:   Wheat bran; Celebrex; and Gluten meal    Social History:  The patient  reports that she has never smoked. She does not have any smokeless tobacco history on file. She reports that she does not drink alcohol or use illicit drugs.   Family History:  The patient's family history includes AAA (abdominal aortic aneurysm) in her brother; Heart attack in her father and mother.    ROS:  Please see the history of present illness.   Otherwise, review of systems are positive for none.   All other systems are reviewed and negative.    PHYSICAL EXAM: VS:  BP 122/78 mmHg  Pulse 82  Ht 5' 7"  (1.702 m)  Wt 246 lb 6.4 oz (111.766 kg)  BMI 38.58 kg/m2  SpO2 93% , BMI Body mass index is 38.58 kg/(m^2). GEN: Well nourished, well developed, in no acute distress HEENT: normal Neck: no JVD, carotid bruits,  or masses Cardiac: RRR; no murmurs, rubs, or gallops,no edema  Respiratory:  clear to auscultation bilaterally, normal work of breathing GI: soft, nontender, nondistended, + BS MS: no deformity or atrophy Skin: warm and dry, no rash Neuro:  Strength and sensation are intact Psych: euthymic mood, full affect   EKG:  EKG is not ordered today.    Recent Labs: 04/07/2014: ALT 34; BUN 22; Creatinine 1.29*; Hemoglobin 15.7*; Platelets 202; Potassium 4.6; Sodium 139    Lipid Panel No results found for: CHOL, TRIG, HDL, CHOLHDL, VLDL, LDLCALC, LDLDIRECT    Wt Readings from Last 3 Encounters:  10/14/14 246 lb 6.4 oz (111.766 kg)  04/08/14 247 lb 6 oz (112.209 kg)  04/07/14 248 lb (112.492 kg)      ASSESSMENT AND PLAN:  1. OSA on CPAP and tolerating well.  - Her AHI today is 0.4/hr on 7cm H2O and 100% compliance in using more than 4 hours nightly 2. HTN -  controlled - continue Prinizide/Verapamil  3. Obesity - She has joined the [facility] and has started using the bike and elliptical.  Current medicines are reviewed at length with the patient today.  The patient does not have concerns regarding medicines.  The following changes have been made:  no change  Labs/ tests ordered today include: see above assessment and plan No orders of the defined types were placed in this encounter.     Disposition:   FU with me in 6 months   Signed, Tina Margarita, MD  10/14/2014 11:05 AM    [facility] [address], [place], [place]  [address] Phone: [phone]; Fax: [phone]

## 2014-10-14 ENCOUNTER — Ambulatory Visit (INDEPENDENT_AMBULATORY_CARE_PROVIDER_SITE_OTHER): Payer: Medicare Other | Admitting: Cardiology

## 2014-10-14 ENCOUNTER — Encounter: Payer: Self-pay | Admitting: Cardiology

## 2014-10-14 VITALS — BP 122/78 | HR 82 | Ht 67.0 in | Wt 246.4 lb

## 2014-10-14 DIAGNOSIS — G4733 Obstructive sleep apnea (adult) (pediatric): Secondary | ICD-10-CM

## 2014-10-14 DIAGNOSIS — E669 Obesity, unspecified: Secondary | ICD-10-CM | POA: Diagnosis not present

## 2014-10-14 DIAGNOSIS — I10 Essential (primary) hypertension: Secondary | ICD-10-CM | POA: Diagnosis not present

## 2014-10-14 NOTE — Patient Instructions (Signed)

## 2014-10-27 ENCOUNTER — Encounter: Payer: Self-pay | Admitting: Cardiology

## 2014-12-03 ENCOUNTER — Other Ambulatory Visit (HOSPITAL_BASED_OUTPATIENT_CLINIC_OR_DEPARTMENT_OTHER): Payer: Self-pay | Admitting: Family Medicine

## 2014-12-03 ENCOUNTER — Ambulatory Visit (HOSPITAL_BASED_OUTPATIENT_CLINIC_OR_DEPARTMENT_OTHER)
Admission: RE | Admit: 2014-12-03 | Discharge: 2014-12-03 | Disposition: A | Discharge status: Home / Self Care | Payer: Medicare Other | Source: Ambulatory Visit | Attending: Family Medicine | Admitting: Family Medicine

## 2014-12-03 DIAGNOSIS — M79662 Pain in left lower leg: Secondary | ICD-10-CM | POA: Diagnosis present

## 2014-12-03 DIAGNOSIS — M79605 Pain in left leg: Secondary | ICD-10-CM

## 2014-12-03 DIAGNOSIS — I82592 Chronic embolism and thrombosis of other specified deep vein of left lower extremity: Secondary | ICD-10-CM | POA: Insufficient documentation

## 2015-02-04 ENCOUNTER — Encounter: Payer: Self-pay | Admitting: Cardiology

## 2015-02-06 ENCOUNTER — Other Ambulatory Visit: Payer: Self-pay | Admitting: Family Medicine

## 2015-02-06 DIAGNOSIS — R1013 Epigastric pain: Secondary | ICD-10-CM

## 2015-02-09 ENCOUNTER — Ambulatory Visit
Admission: RE | Admit: 2015-02-09 | Discharge: 2015-02-09 | Disposition: A | Discharge status: Home / Self Care | Payer: Medicare Other | Source: Ambulatory Visit | Attending: Family Medicine | Admitting: Family Medicine

## 2015-02-09 DIAGNOSIS — R1013 Epigastric pain: Secondary | ICD-10-CM

## 2015-04-14 ENCOUNTER — Encounter: Payer: Self-pay | Admitting: Cardiology

## 2015-04-14 ENCOUNTER — Ambulatory Visit (INDEPENDENT_AMBULATORY_CARE_PROVIDER_SITE_OTHER): Payer: Medicare Other | Admitting: Cardiology

## 2015-04-14 VITALS — BP 150/88 | HR 77 | Ht 67.0 in | Wt 244.8 lb

## 2015-04-14 DIAGNOSIS — G4733 Obstructive sleep apnea (adult) (pediatric): Secondary | ICD-10-CM | POA: Diagnosis not present

## 2015-04-14 DIAGNOSIS — E669 Obesity, unspecified: Secondary | ICD-10-CM | POA: Diagnosis not present

## 2015-04-14 DIAGNOSIS — I10 Essential (primary) hypertension: Secondary | ICD-10-CM | POA: Diagnosis not present

## 2015-04-14 NOTE — Patient Instructions (Addendum)
Medication Instructions:  Your physician recommends that you continue on your current medications as directed. Please refer to the Current Medication list given to you today.   Labwork: None  Testing/Procedures: None  Follow-Up: Your physician wants you to follow-up in: 1 year with Dr. Radford Pax. You will receive a reminder letter in the mail two months in advance. If you don't receive a letter, please call our office to schedule the follow-up appointment.   Any Other Special Instructions Will Be Listed Below (If Applicable). Please check your BLOOD PRESSURE daily for one week and call with results.

## 2015-04-14 NOTE — Progress Notes (Signed)
Cardiology Office Note   Date:  04/14/2015   ID:  Tina Elliott, DOB January 05, 1946, MRN [id number]  PCP:  Tina Heck, MD    Chief Complaint  Patient presents with  . Sleep Apnea  . Hypertension      History of Present Illness: Tina Elliott is a 69 y.o. female with a history of HTN who presented today for followup of OSA. She has mild OSA and is on CPAP at 7cm H2O. She is doing well with her CPAP therapy. She tolerates her nasal mask and feels the pressure is adequate. She feels rested in the am and has no daytime sleepiness. She has not had any problems with nasal congestion. She occasionally has some dryness in her nose.      Past Medical History  Diagnosis Date  . Hypertension   . Hyperlipidemia   . Hypothyroidism   . OSA (obstructive sleep apnea)     mild with AHI 9.58/hr on CPAP at 7cm H2O  . Diastolic dysfunction   . Celiac disease     Past Surgical History  Procedure Laterality Date  . Left knee sx       Current Outpatient Prescriptions  Medication Sig Dispense Refill  . cholecalciferol (VITAMIN D) 1000 UNITS tablet Take 1,000 Units by mouth daily.    Marland Kitchen levothyroxine (SYNTHROID, LEVOTHROID) 100 MCG tablet Take 100 mcg by mouth daily before breakfast.    . lisinopril-hydrochlorothiazide (PRINZIDE,ZESTORETIC) 10-12.5 MG per tablet Take 1 tablet by mouth daily.    Marland Kitchen loratadine (CLARITIN) 10 MG tablet Take 10 mg by mouth daily.    . NON FORMULARY CPAP MACHINE    . pravastatin (PRAVACHOL) 40 MG tablet Take 40 mg by mouth daily.    . Verapamil HCl CR 300 MG CP24 Take 300 mg by mouth daily.      No current facility-administered medications for this visit.    Allergies:   Wheat bran; Celebrex; and Gluten meal    Social History:  The patient  reports that she has never smoked. She has never used smokeless tobacco. She reports that she does not drink alcohol or use illicit drugs.   Family History:  The patient's family history  includes AAA (abdominal aortic aneurysm) in her brother; Heart attack in her father and mother.    ROS:  Please see the history of present illness.   Otherwise, review of systems are positive for none.   All other systems are reviewed and negative.    PHYSICAL EXAM: VS:  BP 150/88 mmHg  Pulse 77  Ht 5' 7"  (1.702 m)  Wt 244 lb 12.8 oz (111.041 kg)  BMI 38.33 kg/m2  SpO2 96% , BMI Body mass index is 38.33 kg/(m^2). GEN: Well nourished, well developed, in no acute distress HEENT: normal Neck: no JVD, carotid bruits, or masses Cardiac: RRR; no murmurs, rubs, or gallops,no edema  Respiratory:  clear to auscultation bilaterally, normal work of breathing GI: soft, nontender, nondistended, + BS MS: no deformity or atrophy Skin: warm and dry, no rash Neuro:  Strength and sensation are intact Psych: euthymic mood, full affect   EKG:  EKG is not ordered today.    Recent Labs: No results found for requested labs within last 365 days.    Lipid Panel No results found for: CHOL, TRIG, HDL, CHOLHDL, VLDL, LDLCALC, LDLDIRECT    Wt Readings from Last 3 Encounters:  04/14/15 244 lb 12.8  oz (111.041 kg)  10/14/14 246 lb 6.4 oz (111.766 kg)  04/08/14 247 lb 6 oz (112.209 kg)     ASSESSMENT AND PLAN:  1. OSA on CPAP and tolerating well.  - Her AHI today is 0.2/hr on 7cm H2O and 100% compliance in using more than 4 hours nightly 2. HTN - borderline controlled.  She is in a lot of back pain which may be the cause. - continue Prinizide/Verapamil  - I have asked her to check her BP daily for a week and call with results.  3. Obesity - She has joined the [employer] using the bike and elliptical.  Current medicines are reviewed at length with the patient today.  The patient does not have concerns regarding medicines.  The following changes have been made:  no change  Labs/ tests ordered today: See above Assessment and Plan No orders of the defined types were placed in this encounter.      Disposition:   FU with me in 1 year  Signed, Sueanne Margarita, MD  04/14/2015 11:33 AM    [facility] [address], [place], [place]  [address] Phone: [phone]; Fax: [phone]

## 2015-07-05 HISTORY — PX: TRIGGER FINGER RELEASE: SHX641

## 2015-07-13 ENCOUNTER — Encounter: Payer: Self-pay | Admitting: Cardiology

## 2015-07-14 ENCOUNTER — Telehealth: Payer: Self-pay | Admitting: Cardiology

## 2015-07-14 DIAGNOSIS — R011 Cardiac murmur, unspecified: Secondary | ICD-10-CM

## 2015-07-14 NOTE — Telephone Encounter (Signed)
No need for OV get 2D echo

## 2015-07-14 NOTE — Telephone Encounter (Signed)
New message      Dr Drema Dallas want to know if Dr Radford Pax needs to see pt because Dr Drema Dallas heard a murmur.  Please call

## 2015-07-14 NOTE — Telephone Encounter (Signed)
OV note requested from Dr. Zachery DauerBarnes' office.  To Dr. Mayford Knifeurner for further recommendations.

## 2015-07-14 NOTE — Telephone Encounter (Signed)
ECHO ordered for scheduling. Patient agrees with treatment plan. 

## 2015-07-23 ENCOUNTER — Other Ambulatory Visit: Payer: Self-pay

## 2015-07-23 DIAGNOSIS — Z1231 Encounter for screening mammogram for malignant neoplasm of breast: Secondary | ICD-10-CM

## 2015-07-28 ENCOUNTER — Other Ambulatory Visit: Payer: Self-pay

## 2015-07-28 ENCOUNTER — Ambulatory Visit (HOSPITAL_COMMUNITY): Payer: Medicare Other | Attending: Cardiovascular Disease

## 2015-07-28 DIAGNOSIS — Z6838 Body mass index (BMI) 38.0-38.9, adult: Secondary | ICD-10-CM | POA: Diagnosis not present

## 2015-07-28 DIAGNOSIS — I350 Nonrheumatic aortic (valve) stenosis: Secondary | ICD-10-CM | POA: Diagnosis not present

## 2015-07-28 DIAGNOSIS — G4733 Obstructive sleep apnea (adult) (pediatric): Secondary | ICD-10-CM | POA: Diagnosis not present

## 2015-07-28 DIAGNOSIS — I071 Rheumatic tricuspid insufficiency: Secondary | ICD-10-CM | POA: Insufficient documentation

## 2015-07-28 DIAGNOSIS — I10 Essential (primary) hypertension: Secondary | ICD-10-CM | POA: Diagnosis not present

## 2015-07-28 DIAGNOSIS — E669 Obesity, unspecified: Secondary | ICD-10-CM | POA: Insufficient documentation

## 2015-07-28 DIAGNOSIS — R011 Cardiac murmur, unspecified: Secondary | ICD-10-CM | POA: Insufficient documentation

## 2015-07-28 DIAGNOSIS — I340 Nonrheumatic mitral (valve) insufficiency: Secondary | ICD-10-CM | POA: Diagnosis not present

## 2015-08-02 ENCOUNTER — Encounter: Payer: Self-pay | Admitting: Cardiology

## 2015-08-02 DIAGNOSIS — I350 Nonrheumatic aortic (valve) stenosis: Secondary | ICD-10-CM | POA: Insufficient documentation

## 2015-08-31 ENCOUNTER — Ambulatory Visit
Admission: RE | Admit: 2015-08-31 | Discharge: 2015-08-31 | Disposition: A | Discharge status: Home / Self Care | Payer: Medicare Other | Source: Ambulatory Visit

## 2015-08-31 DIAGNOSIS — Z1231 Encounter for screening mammogram for malignant neoplasm of breast: Secondary | ICD-10-CM

## 2015-09-01 ENCOUNTER — Telehealth: Payer: Self-pay | Admitting: Cardiology

## 2015-09-01 NOTE — Telephone Encounter (Signed)
New message      Calling to get update on clearance.  Please fax it back to Dr Apolonio Schneiders Maretta Los at [phone].  Pt is having hand surgery and they sent clearance days ago.

## 2015-09-01 NOTE — Telephone Encounter (Signed)
Informed patient clearance was faxed 2/17 but will be faxed again. Printed and faxed to [facility], attn: Jacki Cones [phone].

## 2016-04-25 NOTE — Progress Notes (Signed)
Cardiology Office Note    Date:  04/26/2016   ID:  Glorious Flicker, DOB 02-08-1946, MRN [id number]  PCP:  Gerrit Heck, MD  Cardiologist:  Fransico Him, MD   Chief Complaint  Patient presents with  . Sleep Apnea  . Hypertension    History of Present Illness:  Tina Elliott is a 70 y.o. female with a history of HTN and OSA who presentstoday for followup. She has mild OSA and is on CPAP at 7cm H2O. She is doing well with her CPAP therapy. She tolerates her nasal mask and feels the pressure is adequate. She feels rested in the am and has no daytime sleepiness. She has not had any problems mouth dryness but some nasal dryness.  She has some nasal congestion due to a cold. She is going to the gym to exercise.     Past Medical History:  Diagnosis Date  . Celiac disease   . Diastolic dysfunction   . Hyperlipidemia   . Hypertension   . Hypothyroidism   . OSA (obstructive sleep apnea)    mild with AHI 9.58/hr on CPAP at 7cm H2O    Past Surgical History:  Procedure Laterality Date  . left knee sx      Current Medications: Outpatient Medications Prior to Visit  Medication Sig Dispense Refill  . cholecalciferol (VITAMIN D) 1000 UNITS tablet Take 1,000 Units by mouth daily.    Marland Kitchen levothyroxine (SYNTHROID, LEVOTHROID) 100 MCG tablet Take 100 mcg by mouth daily before breakfast.    . lisinopril-hydrochlorothiazide (PRINZIDE,ZESTORETIC) 10-12.5 MG per tablet Take 1 tablet by mouth daily.    Marland Kitchen loratadine (CLARITIN) 10 MG tablet Take 10 mg by mouth daily.    . NON FORMULARY CPAP MACHINE    . pravastatin (PRAVACHOL) 40 MG tablet Take 40 mg by mouth daily.    . Verapamil HCl CR 300 MG CP24 Take 300 mg by mouth daily.      No facility-administered medications prior to visit.      Allergies:   Wheat bran; Celebrex [celecoxib]; and Gluten meal   Social History   Social History  . Marital status: Married    Spouse name: N/A  . Number of children: N/A  . Years of  education: N/A   Social History Main Topics  . Smoking status: Never Smoker  . Smokeless tobacco: Never Used  . Alcohol use No  . Drug use: No  . Sexual activity: Not Asked   Other Topics Concern  . None   Social History Narrative  . None     Family History:  The patient's family history includes AAA (abdominal aortic aneurysm) in her brother; Heart attack in her father and mother.   ROS:   Please see the history of present illness.    ROS All other systems reviewed and are negative.  No flowsheet data found.     PHYSICAL EXAM:   VS:  BP 124/84   Pulse 73   Ht 5' 7"  (1.702 m)   Wt 245 lb 12.8 oz (111.5 kg)   SpO2 94%   BMI 38.50 kg/m    GEN: Well nourished, well developed, in no acute distress  HEENT: normal  Neck: no JVD, carotid bruits, or masses Cardiac: RRR; no  rubs, or gallops,no edema.  Intact distal pulses bilaterally. 1/6 SM at RUSB to LLSB Respiratory:  clear to auscultation bilaterally, normal work of breathing GI: soft, nontender, nondistended, + BS MS: no deformity or atrophy  Skin: warm and dry, no  rash Neuro:  Alert and Oriented x 3, Strength and sensation are intact Psych: euthymic mood, full affect  Wt Readings from Last 3 Encounters:  04/26/16 245 lb 12.8 oz (111.5 kg)  04/14/15 244 lb 12.8 oz (111 kg)  10/14/14 246 lb 6.4 oz (111.8 kg)      Studies/Labs Reviewed:   EKG:  EKG is ordered today and showed NSR at 70bpm with no ST changes  Recent Labs: No results found for requested labs within last 8760 hours.   Lipid Panel No results found for: CHOL, TRIG, HDL, CHOLHDL, VLDL, LDLCALC, LDLDIRECT  Additional studies/ records that were reviewed today include:  CPAP download    ASSESSMENT:    1. OSA (obstructive sleep apnea)   2. Essential hypertension   3. Class 1 obesity due to excess calories with serious comorbidity in adult, unspecified BMI      PLAN:  In order of problems listed above:  OSA - the patient is tolerating  PAP therapy well without any problems. The PAP download was reviewed today and showed an AHI of 0.1/hr on 7 cm H2O with 100% compliance in using more than 4 hours nightly.  The patient has been using and benefiting from CPAP use and will continue to benefit from therapy.  HTN - BP controlled on current meds.  Continue ACE I/CCB and diuretic.  Obesity - I have encouraged him to get into a routine exercise program and cut back on carbs and portions.      Medication Adjustments/Labs and Tests Ordered: Current medicines are reviewed at length with the patient today.  Concerns regarding medicines are outlined above.  Medication changes, Labs and Tests ordered today are listed in the Patient Instructions below.  There are no Patient Instructions on file for this visit.   Signed, Fransico Him, MD  04/26/2016 9:48 AM    [facility] [address], [place], [place]  [address] Phone: [phone]; Fax: [phone]

## 2016-04-26 ENCOUNTER — Ambulatory Visit (INDEPENDENT_AMBULATORY_CARE_PROVIDER_SITE_OTHER): Payer: Medicare Other | Admitting: Cardiology

## 2016-04-26 ENCOUNTER — Encounter: Payer: Self-pay | Admitting: Cardiology

## 2016-04-26 VITALS — BP 124/84 | HR 73 | Ht 67.0 in | Wt 245.8 lb

## 2016-04-26 DIAGNOSIS — I10 Essential (primary) hypertension: Secondary | ICD-10-CM | POA: Diagnosis not present

## 2016-04-26 DIAGNOSIS — G4733 Obstructive sleep apnea (adult) (pediatric): Secondary | ICD-10-CM

## 2016-04-26 DIAGNOSIS — E6609 Other obesity due to excess calories: Secondary | ICD-10-CM | POA: Diagnosis not present

## 2016-04-26 NOTE — Patient Instructions (Signed)

## 2016-04-28 ENCOUNTER — Telehealth: Payer: Self-pay | Admitting: Cardiology

## 2016-04-28 DIAGNOSIS — R0789 Other chest pain: Secondary | ICD-10-CM

## 2016-04-28 NOTE — Telephone Encounter (Signed)
Please order a stress myoview to rule out ischemia and start Protonix 60m daily

## 2016-04-28 NOTE — Telephone Encounter (Signed)
Pt called stating she spoke w/ Dr. Theodosia Blender nurse about having a gas pain feeling in her chest and wanted to let her know she has had that pain again and was told to call back if it happened again

## 2016-04-28 NOTE — Telephone Encounter (Signed)
Pt was seen in Dr Theodosia Blender clinic on Tuesday 10/24. Pt states she mentioned to MD that she has been having like gas build up and pain in the middle of her chest. Md asked to let her know if it happened again. Yesterday( Wednesday) in the afternoon after dinner pt had the gas build up and pain again  . Pt took thumbs two times during the night. The thumbs helped released the gas, but then she said it left her with a raw feeling. Pt feels fine today.

## 2016-04-29 MED ORDER — PANTOPRAZOLE SODIUM 40 MG PO TBEC: 40.0000 mg | DELAYED_RELEASE_TABLET | Freq: Every day | ORAL | 11 refills | Status: DC

## 2016-04-29 NOTE — Telephone Encounter (Signed)
Instructed patient to START PROTONIX 40 mg daily. Reviewed instructions for stress myoview with patient. She understands to fast for 2 hours prior to test, avoid caffeine and decaf products for 12 hours, wear exercise clothing including close-toed tennis shoes, and to hold Prinzide until after her test. She also understands her stress test will take place over two days. She was grateful for call and agrees with treatment plan.

## 2016-05-02 NOTE — Telephone Encounter (Signed)
Myoview has been scheduled 11/20 and 11/21.

## 2016-05-17 ENCOUNTER — Telehealth (HOSPITAL_COMMUNITY): Payer: Self-pay | Admitting: *Deleted

## 2016-05-17 NOTE — Telephone Encounter (Signed)
Left message on voicemail in reference to upcoming appointment scheduled for 05/23/16. Phone number given for a call back so details instructions can be given.   Tina Elliott, [name]

## 2016-05-20 ENCOUNTER — Telehealth: Payer: Self-pay | Admitting: Cardiology

## 2016-05-20 ENCOUNTER — Telehealth (HOSPITAL_COMMUNITY): Payer: Self-pay | Admitting: *Deleted

## 2016-05-20 NOTE — Telephone Encounter (Signed)
Patient given detailed instructions per Myocardial Perfusion Study Information Sheet for the test on 05/23/16 at 12:30. Patient notified to arrive 15 minutes early and that it is imperative to arrive on time for appointment to keep from having the test rescheduled.  If you need to cancel or reschedule your appointment, please call the office within 24 hours of your appointment. Failure to do so may result in a cancellation of your appointment, and a $50 no show fee. Patient verbalized understanding.Tina Elliott

## 2016-05-20 NOTE — Telephone Encounter (Signed)
Call was from Bonnita LevanJackie Smith, RN in nuclear med.  Spoke w/Sharon who will call her back.

## 2016-05-20 NOTE — Telephone Encounter (Signed)
Follow up   Pt verbalized that she is returning call for rn

## 2016-05-23 ENCOUNTER — Ambulatory Visit (HOSPITAL_COMMUNITY): Payer: Medicare Other | Attending: Cardiovascular Disease

## 2016-05-23 DIAGNOSIS — R0789 Other chest pain: Secondary | ICD-10-CM | POA: Diagnosis not present

## 2016-05-23 MED ORDER — TECHNETIUM TC 99M TETROFOSMIN IV KIT
32.4000 | PACK | Freq: Once | INTRAVENOUS | Status: AC | PRN
Start: 2016-05-23 — End: 2016-05-23
  Administered 2016-05-23: 32.4 via INTRAVENOUS
  Filled 2016-05-23: qty 33

## 2016-05-24 ENCOUNTER — Ambulatory Visit (HOSPITAL_COMMUNITY): Payer: Medicare Other | Attending: Cardiovascular Disease

## 2016-05-24 LAB — MYOCARDIAL PERFUSION IMAGING
CHL CUP MPHR: 148 {beats}/min
CHL CUP NUCLEAR SDS: 4
CHL CUP NUCLEAR SSS: 12
CHL CUP RESTING HR STRESS: 90 {beats}/min
CSEPEDS: 11 s
CSEPEW: 7 METS
CSEPPHR: 134 {beats}/min
Exercise duration (min): 5 min
LHR: 0.37
LV sys vol: 19 mL
LVDIAVOL: 71 mL (ref 46–106)
Percent HR: 91 %
RPE: 18
SRS: 8
TID: 0.92

## 2016-05-24 MED ORDER — TECHNETIUM TC 99M TETROFOSMIN IV KIT
32.3000 | PACK | Freq: Once | INTRAVENOUS | Status: AC | PRN
  Administered 2016-05-24: 32.3 via INTRAVENOUS
  Filled 2016-05-24: qty 33

## 2016-07-14 ENCOUNTER — Other Ambulatory Visit: Payer: Self-pay | Admitting: Family Medicine

## 2016-07-14 DIAGNOSIS — R0989 Other specified symptoms and signs involving the circulatory and respiratory systems: Secondary | ICD-10-CM

## 2016-07-20 ENCOUNTER — Other Ambulatory Visit: Payer: Medicare Other

## 2016-07-26 ENCOUNTER — Ambulatory Visit
Admission: RE | Admit: 2016-07-26 | Discharge: 2016-07-26 | Disposition: A | Discharge status: Home / Self Care | Payer: Medicare Other | Source: Ambulatory Visit | Attending: Family Medicine | Admitting: Family Medicine

## 2016-07-26 DIAGNOSIS — R0989 Other specified symptoms and signs involving the circulatory and respiratory systems: Secondary | ICD-10-CM

## 2016-08-09 ENCOUNTER — Other Ambulatory Visit: Payer: Self-pay | Admitting: Family Medicine

## 2016-08-09 DIAGNOSIS — Z1231 Encounter for screening mammogram for malignant neoplasm of breast: Secondary | ICD-10-CM

## 2016-08-31 ENCOUNTER — Ambulatory Visit
Admission: RE | Admit: 2016-08-31 | Discharge: 2016-08-31 | Disposition: A | Discharge status: Home / Self Care | Payer: Medicare Other | Source: Ambulatory Visit | Attending: Family Medicine | Admitting: Family Medicine

## 2016-08-31 DIAGNOSIS — Z1231 Encounter for screening mammogram for malignant neoplasm of breast: Secondary | ICD-10-CM

## 2016-11-30 ENCOUNTER — Telehealth: Payer: Self-pay

## 2016-11-30 NOTE — Telephone Encounter (Signed)
[facility] was seen in [facility] ED recently for chest pain.   Notes available for review in Care Everywhere.  To Dr. Mayford Knifeurner for review and recommendations.

## 2016-12-01 NOTE — Telephone Encounter (Signed)
Please find out if patient has had any further CP since her ER visit for atypical CP that sounded GI

## 2016-12-01 NOTE — Telephone Encounter (Signed)
Patient states she feels much better after going to the ED. She is scheduling a follow-up appointment with GI for indigestion. She will call if symptoms reoccur. She was grateful for follow-up.

## 2017-04-25 ENCOUNTER — Encounter: Payer: Self-pay | Admitting: Cardiology

## 2017-05-08 ENCOUNTER — Encounter: Payer: Self-pay | Admitting: Cardiology

## 2017-05-09 ENCOUNTER — Encounter: Payer: Self-pay | Admitting: Cardiology

## 2017-05-09 NOTE — Progress Notes (Signed)
Cardiology Office Note:    Date:  05/10/2017   ID:  Fermin Schwab, DOB 05-12-1946, MRN [id number]  PCP:  Leighton Ruff, MD  Cardiologist:  Fransico Him, MD   Referring MD: Leighton Ruff, MD   Chief Complaint  Patient presents with  . Sleep Apnea  . Hypertension    History of Present Illness:    Evelynne Spiers is a 71 y.o. female with a hx of HTN, mild AS and mild OSA on CPAP at 7cm H2O.  She is doing well with her CPAP device and thinks that she has gotten used to it.  She tolerates the mask and feels the pressure is adequate.  Since going on CPAP she feels rested in the am and has no significant daytime sleepiness.  She does not think that he snores.  She does get some mouth dryness and has not been using her chin strap.   Past Medical History:  Diagnosis Date  . Aortic stenosis    mild by echo 2017  . Celiac disease   . Diastolic dysfunction   . Hyperlipidemia   . Hypertension   . Hypothyroidism   . OSA (obstructive sleep apnea)    mild with AHI 9.58/hr on CPAP at 7cm H2O    Past Surgical History:  Procedure Laterality Date  . left knee sx      Current Medications: Current Meds  Medication Sig  . cholecalciferol (VITAMIN D) 1000 UNITS tablet Take 1,000 Units by mouth daily.  Marland Kitchen levothyroxine (SYNTHROID, LEVOTHROID) 100 MCG tablet Take 100 mcg by mouth daily before breakfast.  . lisinopril-hydrochlorothiazide (PRINZIDE,ZESTORETIC) 10-12.5 MG per tablet Take 1 tablet by mouth daily.  Marland Kitchen loratadine (CLARITIN) 10 MG tablet Take 10 mg by mouth daily.  . NON FORMULARY CPAP MACHINE  . pantoprazole (PROTONIX) 40 MG tablet Take 1 tablet (40 mg total) by mouth daily.  . pravastatin (PRAVACHOL) 40 MG tablet Take 40 mg by mouth daily.  . Verapamil HCl CR 300 MG CP24 Take 300 mg by mouth daily.      Allergies:   Wheat bran; Celebrex [celecoxib]; and Gluten meal   Social History   Socioeconomic History  . Marital status: Married    Spouse name: None  . Number of  children: None  . Years of education: None  . Highest education level: None  Social Needs  . Financial resource strain: None  . Food insecurity - worry: None  . Food insecurity - inability: None  . Transportation needs - medical: None  . Transportation needs - non-medical: None  Occupational History  . None  Tobacco Use  . Smoking status: Never Smoker  . Smokeless tobacco: Never Used  Substance and Sexual Activity  . Alcohol use: No    Alcohol/week: 0.0 oz  . Drug use: No  . Sexual activity: None  Other Topics Concern  . None  Social History Narrative  . None     Family History: The patient's family history includes AAA (abdominal aortic aneurysm) in her brother; Heart attack in her father and mother.  ROS:   Please see the history of present illness.    ROS  All other systems reviewed and negative.   EKGs/Labs/Other Studies Reviewed:    The following studies were reviewed today: CPAP download  EKG:  EKG is not ordered today.    Recent Labs: No results found for requested labs within last 8760 hours.   Recent Lipid Panel No results found for: CHOL, TRIG, HDL, CHOLHDL, VLDL, LDLCALC, LDLDIRECT  Physical Exam:    VS:  BP (!) 106/54   Pulse 71   Ht 5' 7"  (1.702 m)   Wt 221 lb 3.2 oz (100.3 kg)   SpO2 95%   BMI 34.64 kg/m     Wt Readings from Last 3 Encounters:  05/10/17 221 lb 3.2 oz (100.3 kg)  05/23/16 245 lb (111.1 kg)  04/26/16 245 lb 12.8 oz (111.5 kg)     GEN:  Well nourished, well developed in no acute distress HEENT: Normal NECK: No JVD; No carotid bruits LYMPHATICS: No lymphadenopathy CARDIAC: RRR, no murmurs, rubs, gallops RESPIRATORY:  Clear to auscultation without rales, wheezing or rhonchi  ABDOMEN: Soft, non-tender, non-distended MUSCULOSKELETAL:  No edema; No deformity  SKIN: Warm and dry NEUROLOGIC:  Alert and oriented x 3 PSYCHIATRIC:  Normal affect   ASSESSMENT:    1. Aortic stenosis, mild   2. Essential hypertension   3.  OSA (obstructive sleep apnea)   4. Class 2 severe obesity due to excess calories with serious comorbidity in adult, unspecified BMI ([place])    PLAN:    In order of problems listed above:  1.  Mild AS - mild by echo 2017.   2.  HTN - her BP is well controlled on exam today.  She will continue on Lisinopril-HCT 10-12.77m daily and Verapamil Cr 3066mdaily.    3.  OSA - the patient is tolerating PAP therapy well without any problems. The PAP download was reviewed today and showed an AHI of 0.2/hr on 7 cm H2O with 100% compliance in using more than 4 hours nightly.  The patient has been using and benefiting from CPAP use and will continue to benefit from therapy. She would like to try a nasal pillow mask [facility]  Obesity - I have encouraged her to continue in her routine exercise program.   Medication Adjustments/Labs and Tests Ordered: Current medicines are reviewed at length with the patient today.  Concerns regarding medicines are outlined above.  No orders of the defined types were placed in this encounter.  No orders of the defined types were placed in this encounter.   Signed, TrFransico HimMD  05/10/2017 9:15 AM    [place]

## 2017-05-10 ENCOUNTER — Encounter (INDEPENDENT_AMBULATORY_CARE_PROVIDER_SITE_OTHER): Payer: Self-pay

## 2017-05-10 ENCOUNTER — Ambulatory Visit: Payer: Medicare Other | Admitting: Cardiology

## 2017-05-10 ENCOUNTER — Encounter: Payer: Self-pay | Admitting: Cardiology

## 2017-05-10 VITALS — BP 106/54 | HR 71 | Ht 67.0 in | Wt 221.2 lb

## 2017-05-10 DIAGNOSIS — G4733 Obstructive sleep apnea (adult) (pediatric): Secondary | ICD-10-CM | POA: Diagnosis not present

## 2017-05-10 DIAGNOSIS — I10 Essential (primary) hypertension: Secondary | ICD-10-CM | POA: Diagnosis not present

## 2017-05-10 DIAGNOSIS — I350 Nonrheumatic aortic (valve) stenosis: Secondary | ICD-10-CM | POA: Diagnosis not present

## 2017-05-10 NOTE — Patient Instructions (Signed)
Medication Instructions:  Your provider recommends that you continue on your current medications as directed. Please refer to the Current Medication list given to you today.    Labwork: None  Testing/Procedures: None  Follow-Up: Your provider wants you to follow-up in: 1 year with Dr. Radford Pax. You will receive a reminder letter in the mail two months in advance. If you don't receive a letter, please call our office to schedule the follow-up appointment.    Any Other Special Instructions Will Be Listed Below (If Applicable). Dr. Radford Pax has ordered a new mask for you. Please call our Sleep Assistant, Gae Bon, directly at [phone] if you do not hear from your medical equipment company in the next few days.     If you need a refill on your cardiac medications before your next appointment, please call your pharmacy.

## 2017-05-15 ENCOUNTER — Telehealth: Payer: Self-pay | Admitting: *Deleted

## 2017-05-15 DIAGNOSIS — G4733 Obstructive sleep apnea (adult) (pediatric): Secondary | ICD-10-CM

## 2017-05-15 NOTE — Telephone Encounter (Signed)
-----   Message from Henrietta DineKathryn A Kemp, RN sent at 05/10/2017  9:34 AM EST ----- Regarding: dme order dme order placed at [facility] today. She's with [facility]  thanks

## 2017-05-15 NOTE — Telephone Encounter (Addendum)
Correction!!! Change to a mask re-fit please and download in 4 weeks, thanks

## 2017-05-15 NOTE — Telephone Encounter (Signed)
[facility] notified of new order  

## 2017-05-15 NOTE — Addendum Note (Signed)
Addended by: Reesa ChewJONES, Paticia Moster G on: 05/15/2017 03:07 PM   Modules accepted: Orders

## 2017-07-13 ENCOUNTER — Other Ambulatory Visit: Payer: Self-pay | Admitting: Cardiology

## 2017-07-24 ENCOUNTER — Other Ambulatory Visit: Payer: Self-pay | Admitting: Family Medicine

## 2017-07-24 DIAGNOSIS — Z139 Encounter for screening, unspecified: Secondary | ICD-10-CM

## 2017-09-01 ENCOUNTER — Ambulatory Visit
Admission: RE | Admit: 2017-09-01 | Discharge: 2017-09-01 | Disposition: A | Discharge status: Home / Self Care | Payer: Medicare Other | Source: Ambulatory Visit | Attending: Family Medicine | Admitting: Family Medicine

## 2017-09-01 DIAGNOSIS — Z139 Encounter for screening, unspecified: Secondary | ICD-10-CM

## 2018-06-01 ENCOUNTER — Encounter (HOSPITAL_COMMUNITY): Payer: Self-pay | Admitting: Emergency Medicine

## 2018-06-01 ENCOUNTER — Emergency Department (HOSPITAL_COMMUNITY): Payer: Medicare Other

## 2018-06-01 ENCOUNTER — Emergency Department (HOSPITAL_COMMUNITY)
Admission: EM | Admit: 2018-06-01 | Discharge: 2018-06-01 | Disposition: A | Discharge status: Home / Self Care | Payer: Medicare Other | Attending: Emergency Medicine | Admitting: Emergency Medicine

## 2018-06-01 DIAGNOSIS — Z79899 Other long term (current) drug therapy: Secondary | ICD-10-CM | POA: Diagnosis not present

## 2018-06-01 DIAGNOSIS — R0789 Other chest pain: Secondary | ICD-10-CM | POA: Diagnosis not present

## 2018-06-01 DIAGNOSIS — M545 Low back pain, unspecified: Secondary | ICD-10-CM

## 2018-06-01 DIAGNOSIS — K8020 Calculus of gallbladder without cholecystitis without obstruction: Secondary | ICD-10-CM | POA: Diagnosis not present

## 2018-06-01 DIAGNOSIS — R0602 Shortness of breath: Secondary | ICD-10-CM | POA: Diagnosis not present

## 2018-06-01 DIAGNOSIS — I10 Essential (primary) hypertension: Secondary | ICD-10-CM | POA: Insufficient documentation

## 2018-06-01 DIAGNOSIS — E039 Hypothyroidism, unspecified: Secondary | ICD-10-CM | POA: Insufficient documentation

## 2018-06-01 LAB — CBC WITH DIFFERENTIAL/PLATELET
Abs Immature Granulocytes: 0.02 10*3/uL (ref 0.00–0.07)
Basophils Absolute: 0.1 10*3/uL (ref 0.0–0.1)
Basophils Relative: 1 %
Eosinophils Absolute: 0.1 10*3/uL (ref 0.0–0.5)
Eosinophils Relative: 1 %
HCT: 46.5 % — ABNORMAL HIGH (ref 36.0–46.0)
Hemoglobin: 14.6 g/dL (ref 12.0–15.0)
Immature Granulocytes: 0 %
Lymphocytes Relative: 27 %
Lymphs Abs: 1.5 10*3/uL (ref 0.7–4.0)
MCH: 29.1 pg (ref 26.0–34.0)
MCHC: 31.4 g/dL (ref 30.0–36.0)
MCV: 92.6 fL (ref 80.0–100.0)
MONO ABS: 0.4 10*3/uL (ref 0.1–1.0)
Monocytes Relative: 7 %
Neutro Abs: 3.5 10*3/uL (ref 1.7–7.7)
Neutrophils Relative %: 64 %
Platelets: 87 10*3/uL — ABNORMAL LOW (ref 150–400)
RBC: 5.02 MIL/uL (ref 3.87–5.11)
RDW: 12.9 % (ref 11.5–15.5)
WBC: 5.6 10*3/uL (ref 4.0–10.5)
nRBC: 0 % (ref 0.0–0.2)

## 2018-06-01 LAB — COMPREHENSIVE METABOLIC PANEL
ALT: 32 U/L (ref 0–44)
AST: 35 U/L (ref 15–41)
Albumin: 4.1 g/dL (ref 3.5–5.0)
Alkaline Phosphatase: 65 U/L (ref 38–126)
Anion gap: 10 (ref 5–15)
BUN: 18 mg/dL (ref 8–23)
CO2: 23 mmol/L (ref 22–32)
Calcium: 10.1 mg/dL (ref 8.9–10.3)
Chloride: 107 mmol/L (ref 98–111)
Creatinine, Ser: 1.21 mg/dL — ABNORMAL HIGH (ref 0.44–1.00)
GFR calc Af Amer: 51 mL/min — ABNORMAL LOW (ref >60)
GFR calc non Af Amer: 44 mL/min — ABNORMAL LOW (ref >60)
Glucose, Bld: 120 mg/dL — ABNORMAL HIGH (ref 70–99)
Potassium: 3.9 mmol/L (ref 3.5–5.1)
Sodium: 140 mmol/L (ref 135–145)
Total Bilirubin: 1.1 mg/dL (ref 0.3–1.2)
Total Protein: 7.3 g/dL (ref 6.5–8.1)

## 2018-06-01 LAB — URINALYSIS, ROUTINE W REFLEX MICROSCOPIC
Bilirubin Urine: NEGATIVE
Glucose, UA: NEGATIVE mg/dL
HGB URINE DIPSTICK: NEGATIVE
Ketones, ur: NEGATIVE mg/dL
Leukocytes, UA: NEGATIVE
Nitrite: NEGATIVE
Protein, ur: NEGATIVE mg/dL
Specific Gravity, Urine: 1.006 (ref 1.005–1.030)
pH: 8 (ref 5.0–8.0)

## 2018-06-01 LAB — I-STAT TROPONIN, ED
Troponin i, poc: 0 ng/mL (ref 0.00–0.08)
Troponin i, poc: 0 ng/mL (ref 0.00–0.08)

## 2018-06-01 LAB — LIPASE, BLOOD: LIPASE: 43 U/L (ref 11–51)

## 2018-06-01 MED ORDER — ONDANSETRON HCL 4 MG/2ML IJ SOLN
4.0000 mg | Freq: Once | INTRAMUSCULAR | Status: DC
  Filled 2018-06-01 (×2): qty 2

## 2018-06-01 MED ORDER — IOPAMIDOL (ISOVUE-370) INJECTION 76%
INTRAVENOUS | Status: AC
  Administered 2018-06-01: 100 mL
  Filled 2018-06-01: qty 100

## 2018-06-01 MED ORDER — FENTANYL CITRATE (PF) 100 MCG/2ML IJ SOLN
50.0000 ug | Freq: Once | INTRAMUSCULAR | Status: AC
  Administered 2018-06-01: 50 ug via INTRAVENOUS
  Filled 2018-06-01: qty 2

## 2018-06-01 NOTE — ED Provider Notes (Signed)
TIME SEEN: 5:05 AM  CHIEF COMPLAINT: Chest pain, abdominal pain, back pain  HPI: Patient is a 72 year old female with history of hypertension, hyperlipidemia, hypothyroidism, obesity, celiac disease who presents to the emergency department with left-sided chest pain that started at 2 AM while she was sleeping.  Describes as feeling like there was a "bubble" in her chest and having chest pressure.  She felt short of breath but no nausea, diaphoresis or dizziness.  Pain then radiated into her left abdomen and lower back and she feels like she is having back spasms.  No aggravating or alleviating factors.  Had similar symptoms she states 2 years ago at a hospital in [facility].  States that work-up there was unremarkable.  No previous history of abdominal surgery.  No history of CAD, aortic dissection or aneurysm.  No vomiting.  Had 3 normal bowel movements tonight without blood or melena.  No diarrhea.  No injury to her back.  No numbness, tingling or focal weakness.  No bowel or bladder incontinence.  ROS: See HPI Constitutional: no fever  Eyes: no drainage  ENT: no runny nose   Cardiovascular:   chest pain  Resp: no SOB  GI: no vomiting GU: no dysuria Integumentary: no rash  Allergy: no hives  Musculoskeletal: no leg swelling  Neurological: no slurred speech ROS otherwise negative  PAST MEDICAL HISTORY/PAST SURGICAL HISTORY:  Past Medical History:  Diagnosis Date  . Aortic stenosis    mild by echo 2017  . Celiac disease   . Diastolic dysfunction   . Hyperlipidemia   . Hypertension   . Hypothyroidism   . OSA (obstructive sleep apnea)    mild with AHI 9.58/hr on CPAP at 7cm H2O    MEDICATIONS:  Prior to Admission medications   Medication Sig Start Date End Date Taking? Authorizing Provider  cholecalciferol (VITAMIN D) 1000 UNITS tablet Take 1,000 Units by mouth daily.    [provider]  levothyroxine (SYNTHROID, LEVOTHROID) 100 MCG tablet Take 100 mcg by mouth  daily before breakfast.    [provider]  lisinopril-hydrochlorothiazide (PRINZIDE,ZESTORETIC) 10-12.5 MG per tablet Take 1 tablet by mouth daily.    [provider]  loratadine (CLARITIN) 10 MG tablet Take 10 mg by mouth daily.    [provider]  NON FORMULARY CPAP MACHINE    [provider]  pantoprazole (PROTONIX) 40 MG tablet TAKE ONE TABLET BY MOUTH DAILY 07/13/17   Sueanne Margarita, MD  pravastatin (PRAVACHOL) 40 MG tablet Take 40 mg by mouth daily.    [provider]  Verapamil HCl CR 300 MG CP24 Take 300 mg by mouth daily.     [provider]    ALLERGIES:  Allergies  Allergen Reactions  . Wheat Bran Anaphylaxis  . Celebrex [Celecoxib] Other (See Comments)    GI issues  . Gluten Meal Other (See Comments)    Celiac disease    SOCIAL HISTORY:  Social History   Tobacco Use  . Smoking status: Never Smoker  . Smokeless tobacco: Never Used  Substance Use Topics  . Alcohol use: No    Alcohol/week: 0.0 standard drinks    FAMILY HISTORY: Family History  Problem Relation Age of Onset  . Heart attack Mother   . Heart attack Father   . AAA (abdominal aortic aneurysm) Brother     EXAM: BP (!) 170/55 (BP Location: Right Arm)   Pulse 79   Temp 98.4 F (36.9 C) (Oral)   Resp 18  SpO2 100%  CONSTITUTIONAL: Alert and oriented and responds appropriately to questions.  Elderly, appears uncomfortable HEAD: Normocephalic EYES: Conjunctivae clear, pupils appear equal, EOMI ENT: normal nose; moist mucous membranes NECK: Supple, no meningismus, no nuchal rigidity, no LAD  CARD: RRR; S1 and S2 appreciated; no murmurs, no clicks, no rubs, no gallops RESP: Normal chest excursion without splinting or tachypnea; breath sounds clear and equal bilaterally; no wheezes, no rhonchi, no rales, no hypoxia or respiratory distress, speaking full sentences ABD/GI: Normal bowel sounds; non-distended; soft, tender throughout the left upper and  left lower quadrants, no rebound, no guarding, no peritoneal signs, no hepatosplenomegaly BACK:  The back appears normal and is non-tender to palpation, there is no CVA tenderness, unable to reproduce back pain with palpation, no midline spinal tenderness or step-off or deformity EXT: Normal ROM in all joints; non-tender to palpation; no edema; normal capillary refill; no cyanosis, no calf tenderness or swelling    SKIN: Normal color for age and race; warm; no rash NEURO: Moves all extremities equally, reports normal sensation diffusely, no saddle anesthesia PSYCH: The patient's mood and manner are appropriate. Grooming and personal hygiene are appropriate.  MEDICAL DECISION MAKING: Patient here with chest pain that now radiates down her abdomen and into her back.  Differential includes ACS, dissection, colitis, diverticulitis, bowel obstruction.  Less likely cauda equina, epidural abscess or hematoma, discitis or osteomyelitis, transverse myelitis.  Will obtain labs, urine, CT of her chest, abdomen and pelvis for further evaluation.  Will give pain and nausea medicine.  EKG shows no ischemic abnormality or arrhythmia.  ED PROGRESS: On review of records, it appears patient was admitted to the hospital in [place] in May 2018 for chest pain.  At that time had negative cardiac enzymes clear chest x-ray.  No CT imaging performed.  7:45 AM  Pt's labs are reassuring.  No leukocytosis.  Mildly elevated creatinine of 1.2.  Normal LFTs, lipase.  Negative troponin.  And shows no blood or signs of infection.  Pain has been well controlled and now her blood pressure has improved to 120s/50s.  CT scan pending.  We will also repeat troponin at 8:35 AM.  Signed out to Dr. Jeanell Sparrow to follow-up on CT imaging, repeat troponin.  I reviewed all nursing notes, vitals, pertinent previous records, EKGs, lab and urine results, imaging (as available).    EKG Interpretation  Date/Time:  Friday June 01 2018  04:57:56 EST Ventricular Rate:  80 PR Interval:    QRS Duration: 93 QT Interval:  404 QTC Calculation: 466 R Axis:   9 Text Interpretation:  Sinus rhythm Low voltage, precordial leads No old tracing to compare Confirmed by Kano Heckmann, Cyril Mourning [phone]) on 06/01/2018 5:35:55 AM         Tam Delisle, Delice Bison, DO 06/01/18 [id number]

## 2018-06-01 NOTE — ED Provider Notes (Signed)
Patient signed out to me from Dr. Leonides Schanz pending CT NGO chest and abdomen.  This is a 72 year old woman who came in last night with some pain that began under her left breast then radiated down into her abdomen.  It has since resolved.  Evaluation here revealed no evidence of acute ischemia.  Dr. Leonides Schanz felt that given the nature of the pain, she should be evaluated with a CT for dissection.  She has had one similar episode in the past that was not evaluate related with CT.  She is also had a repeat repeat troponin done.  CT angios reveals no evidence of dissection.  However, there is one small stone with slight gallbladder wall thickening.  Repeat troponin is normal.  Review of labs and complaints revealed no evidence of acute cholecystitis.  I discussed these findings with the patient.  She is given referral to general surgery for outpatient follow-up regarding the gallstone.  We discussed return precautions and need for follow-up and she voices understanding.   Tina Boss, MD 06/01/18 0900

## 2018-06-01 NOTE — ED Notes (Signed)
RN attempted twice.  

## 2018-06-01 NOTE — ED Notes (Signed)
Pt verbalized understanding of discharge paperwork and follow-up care.  °

## 2018-06-01 NOTE — Discharge Instructions (Addendum)
There was no evidence of dissection of your aorta or great vessels seen on CT Your repeat troponin (heart enzyme) is normal However, seen on the CT scan is a gallstone with some possible thickening.  Your lab work and your presentation do not indicate that you have acute cholecystitis.  However avoid fatty foods, and return if you have increased abdominal pain or inability to tolerate fluids.  You are given a referral to general surgery to consult regarding whether or not he should have further treatment for this.

## 2018-06-01 NOTE — ED Triage Notes (Signed)
Pt reports chest pain that started under her left breast that woke her up out of a sleep. She had a BM and then the pain moved to her back muscles described at cramping.

## 2018-06-01 NOTE — ED Notes (Signed)
MD aware two RN attempted IV unsuccessful. IV team at bedside.

## 2018-06-05 NOTE — Progress Notes (Signed)
Cardiology Office Note:    Date:  06/06/2018   ID:  Tina Elliott, DOB Oct 20, 1945, MRN [id number]  PCP:  Leighton Ruff, MD  Cardiologist:  No primary care provider on file.    Referring MD: Leighton Ruff, MD   Chief Complaint  Patient presents with  . Sleep Apnea  . Hypertension    History of Present Illness:    Tina Elliott is a 72 y.o. female with a hx of HTN, mild AS and mild OSAon CPAP at 7cm H2O. He is doing well with his CPAP device and thinks that she has gotten used to it.  She tolerates the mask and feels the pressure is adequate.  Since going on CPAP she feels rested in the am and has no significant daytime sleepiness.  She denies any significant mouth or nasal dryness or nasal congestion.  She does not think that he snores. She was evaluated in ER last week with CT showing gallstone and mild GB wall thickening and is pending surgery evaluation.  There is no exertional component to the pain and it resolves with Mylanta. She says that she is able to do housework and walk without any chest pain or pressure.  Her EKG and troponin were normal in the ER.     Past Medical History:  Diagnosis Date  . Aortic stenosis    mild by echo 2017  . Celiac disease   . Diastolic dysfunction   . Hyperlipidemia   . Hypertension   . Hypothyroidism   . OSA (obstructive sleep apnea)    mild with AHI 9.58/hr on CPAP at 7cm H2O    Past Surgical History:  Procedure Laterality Date  . left knee sx      Current Medications: Current Meds  Medication Sig  . alum & mag hydroxide-simeth (MAALOX/MYLANTA) 200-200-20 MG/5ML suspension Take 30 mLs by mouth as needed for indigestion or heartburn.  Marland Kitchen ascorbic acid (VITAMIN C) 1000 MG tablet Take 1,000 mg by mouth daily.  Marland Kitchen aspirin EC 81 MG tablet Take 81 mg by mouth daily.  . cholecalciferol (VITAMIN D) 1000 UNITS tablet Take 1,000 Units by mouth daily.  Marland Kitchen levothyroxine (SYNTHROID, LEVOTHROID) 100 MCG tablet Take 100 mcg by mouth daily  before breakfast.  . lisinopril-hydrochlorothiazide (PRINZIDE,ZESTORETIC) 10-12.5 MG per tablet Take 1 tablet by mouth daily.  Marland Kitchen loratadine (CLARITIN) 10 MG tablet Take 10 mg by mouth daily.  . Multiple Vitamin (MULTIVITAMIN WITH MINERALS) TABS tablet Take 1 tablet by mouth daily.  . NON FORMULARY CPAP MACHINE  . Omega-3 Fatty Acids (FISH OIL) 1000 MG CAPS Take 2,000 mg by mouth daily.  . pantoprazole (PROTONIX) 40 MG tablet Take 40 mg by mouth daily.  . pravastatin (PRAVACHOL) 40 MG tablet Take 40 mg by mouth daily.  . Verapamil HCl CR 300 MG CP24 Take 300 mg by mouth daily.      Allergies:   Wheat bran; Celebrex [celecoxib]; and Gluten meal   Social History   Socioeconomic History  . Marital status: Married    Spouse name: Not on file  . Number of children: Not on file  . Years of education: Not on file  . Highest education level: Not on file  Occupational History  . Not on file  Social Needs  . Financial resource strain: Not on file  . Food insecurity:    Worry: Not on file    Inability: Not on file  . Transportation needs:    Medical: Not on file    Non-medical: Not  on file  Tobacco Use  . Smoking status: Never Smoker  . Smokeless tobacco: Never Used  Substance and Sexual Activity  . Alcohol use: No    Alcohol/week: 0.0 standard drinks  . Drug use: No  . Sexual activity: Not on file  Lifestyle  . Physical activity:    Days per week: Not on file    Minutes per session: Not on file  . Stress: Not on file  Relationships  . Social connections:    Talks on phone: Not on file    Gets together: Not on file    Attends religious service: Not on file    Active member of club or organization: Not on file    Attends meetings of clubs or organizations: Not on file    Relationship status: Not on file  Other Topics Concern  . Not on file  Social History Narrative  . Not on file     Family History: The patient's family history includes AAA (abdominal aortic aneurysm) in  her brother; Heart attack in her father and mother.  ROS:   Please see the history of present illness.    ROS  All other systems reviewed and negative.   EKGs/Labs/Other Studies Reviewed:    The following studies were reviewed today: PAP download  EKG:  EKG is not ordered today.    Recent Labs: 06/01/2018: ALT 32; BUN 18; Creatinine, Ser 1.21; Hemoglobin 14.6; Platelets 87; Potassium 3.9; Sodium 140   Recent Lipid Panel No results found for: CHOL, TRIG, HDL, CHOLHDL, VLDL, LDLCALC, LDLDIRECT  Physical Exam:    VS:  BP (!) 142/78   Pulse 85   Ht 5' 7"  (1.702 m)   Wt 232 lb 12.8 oz (105.6 kg)   SpO2 98%   BMI 36.46 kg/m     Wt Readings from Last 3 Encounters:  06/06/18 232 lb 12.8 oz (105.6 kg)  05/10/17 221 lb 3.2 oz (100.3 kg)  05/23/16 245 lb (111.1 kg)     GEN:  Well nourished, well developed in no acute distress HEENT: Normal NECK: No JVD; No carotid bruits LYMPHATICS: No lymphadenopathy CARDIAC: RRR, no murmurs, rubs, gallops RESPIRATORY:  Clear to auscultation without rales, wheezing or rhonchi  ABDOMEN: Soft, non-tender, non-distended MUSCULOSKELETAL:  No edema; No deformity  SKIN: Warm and dry NEUROLOGIC:  Alert and oriented x 3 PSYCHIATRIC:  Normal affect   ASSESSMENT:    1. OSA (obstructive sleep apnea)   2. Essential hypertension   3. Class 2 severe obesity due to excess calories with serious comorbidity in adult, unspecified BMI ([place])   4. Aortic stenosis, mild    PLAN:    In order of problems listed above:  1.  OSA - the patient is tolerating PAP therapy well without any problems. The PAP download was reviewed today and showed an AHI of 0.3/hr on 7 cm H2O with 97% compliance in using more than 4 hours nightly.  The patient has been using and benefiting from PAP use and will continue to benefit from therapy.   2.  HTN - BP is controlled on exam today.  She will continue on Lisinopril HCT 10-12.37m daily and Verapamil 302mdaily.   3.  Obesity  - I have encouraged him to get into a routine exercise program and cut back on carbs and portions.   4.  Mild aortic stenosis - mild with mean gradient 1159m on echo 2017.  I will repeat echo to make sure this is stable.   5.  Chest pain - evaluated in ER last week with CT showing gallstone and mild GB wall thickening and is pending surgery evaluation.  There is no exertional component to the pain and it resolves with Mylanta.  Her EKG and troponin were normal in the ER.       Medication Adjustments/Labs and Tests Ordered: Current medicines are reviewed at length with the patient today.  Concerns regarding medicines are outlined above.  No orders of the defined types were placed in this encounter.  No orders of the defined types were placed in this encounter.   Signed, Fransico Him, MD  06/06/2018 8:59 AM    [place]

## 2018-06-06 ENCOUNTER — Ambulatory Visit: Payer: Medicare Other | Admitting: Cardiology

## 2018-06-06 ENCOUNTER — Encounter: Payer: Self-pay | Admitting: Cardiology

## 2018-06-06 VITALS — BP 142/78 | HR 85 | Ht 67.0 in | Wt 232.8 lb

## 2018-06-06 DIAGNOSIS — I10 Essential (primary) hypertension: Secondary | ICD-10-CM | POA: Diagnosis not present

## 2018-06-06 DIAGNOSIS — I350 Nonrheumatic aortic (valve) stenosis: Secondary | ICD-10-CM | POA: Diagnosis not present

## 2018-06-06 DIAGNOSIS — G4733 Obstructive sleep apnea (adult) (pediatric): Secondary | ICD-10-CM

## 2018-06-06 NOTE — Patient Instructions (Signed)
Medication Instructions:  Your physician recommends that you continue on your current medications as directed. Please refer to the Current Medication list given to you today.  If you need a refill on your cardiac medications before your next appointment, please call your pharmacy.   Lab work:  If you have labs (blood work) drawn today and your tests are completely normal, you will receive your results only by: Marland Kitchen MyChart Message (if you have MyChart) OR . A paper copy in the mail If you have any lab test that is abnormal or we need to change your treatment, we will call you to review the results.  Testing/Procedures: Your physician has requested that you have an echocardiogram. Echocardiography is a painless test that uses sound waves to create images of your heart. It provides your doctor with information about the size and shape of your heart and how well your heart's chambers and valves are working. This procedure takes approximately one hour. There are no restrictions for this procedure.  Follow-Up: At [facility], you and your health needs are our priority.  As part of our continuing mission to provide you with exceptional heart care, we have created designated Provider Care Teams.  These Care Teams include your primary Cardiologist (physician) and Advanced Practice Providers (APPs -  Physician Assistants and Nurse Practitioners) who all work together to provide you with the care you need, when you need it. You will need a follow up appointment in 1 years.  Please call our office 2 months in advance to schedule this appointment.  You may see Dr. Radford Pax or one of the following Advanced Practice Providers on your designated Care Team:   Juda, PA-C Melina Copa, PA-C . Ermalinda Barrios, PA-C

## 2018-06-07 ENCOUNTER — Telehealth: Payer: Self-pay | Admitting: *Deleted

## 2018-06-07 NOTE — Telephone Encounter (Signed)
-----   Message from Dustin FlockBen G Kordsmeier, RN sent at 06/06/2018 10:39 AM EST ----- Regarding: Sleep Hello, Dr. Mayford Knifeurner ordered a Resmed Airfit N30 Mask. Please send. Thanks, [employer] IncBen

## 2018-06-07 NOTE — Telephone Encounter (Addendum)
Order placed to [facility] VIA COMMUNITY MESSAGE.

## 2018-06-08 ENCOUNTER — Other Ambulatory Visit: Payer: Self-pay

## 2018-06-08 ENCOUNTER — Ambulatory Visit (HOSPITAL_COMMUNITY): Payer: Medicare Other | Attending: Cardiology

## 2018-06-08 DIAGNOSIS — I350 Nonrheumatic aortic (valve) stenosis: Secondary | ICD-10-CM | POA: Insufficient documentation

## 2018-06-12 ENCOUNTER — Other Ambulatory Visit: Payer: Self-pay | Admitting: Family Medicine

## 2018-06-12 ENCOUNTER — Ambulatory Visit: Payer: Self-pay | Admitting: Surgery

## 2018-06-12 DIAGNOSIS — K8020 Calculus of gallbladder without cholecystitis without obstruction: Secondary | ICD-10-CM

## 2018-06-12 NOTE — H&P (Signed)
Surgical H&P  CC: abdominal/ chest pain  HPI: This is a very pleasant 72 year old woman is referred for discussion of cholecystectomy following an abnormal appearance of her gallbladder on CT scan. She presented to the emergency room on November 29 with acute onset severe pressure-like pain in the left lower chest and subcostal region radiating to the back. This woke her from sleep and was not related to inactivity. This, of course, was the morning after Thanksgiving. She underwent a CTA as there was concern for dissection and this revealed no pathology to explain her symptoms, however she was noted to have a gallbladder that was mildly distended with a mildly thickened wall and likely stone. She has since seen Dr. Radford Pax in cardiology and it is felt that her symptoms were not cardiac in etiology. She has had 2 previous attacks of pain like this, but they were more centrally in the upper abdomen and lower chest. With each of these attacks, she feels that the worse of the pain is in her back, she feels the muscles in her back again tightened and will not relax. She denies any nausea, queasiness or fever associated with these attacks. She does see Dr. Michail Sermon for reflux, she states she has had an endoscopy recently which was negative for hiatal hernia or other abnormality but that she does take Protonix. She states that she is overdue for colonoscopy  Allergies  Allergen Reactions  . Wheat Bran Anaphylaxis  . Celebrex [Celecoxib] Other (See Comments)    GI issues  . Gluten Meal Other (See Comments)    Celiac disease    Past Medical History:  Diagnosis Date  . Aortic stenosis    mild by echo 2017  . Celiac disease   . Diastolic dysfunction   . Hyperlipidemia   . Hypertension   . Hypothyroidism   . OSA (obstructive sleep apnea)    mild with AHI 9.58/hr on CPAP at 7cm H2O    Past Surgical History:  Procedure Laterality Date  . left knee sx      Family History  Problem Relation  Age of Onset  . Heart attack Mother   . Heart attack Father   . AAA (abdominal aortic aneurysm) Brother     Social History   Socioeconomic History  . Marital status: Married    Spouse name: Not on file  . Number of children: Not on file  . Years of education: Not on file  . Highest education level: Not on file  Occupational History  . Not on file  Social Needs  . Financial resource strain: Not on file  . Food insecurity:    Worry: Not on file    Inability: Not on file  . Transportation needs:    Medical: Not on file    Non-medical: Not on file  Tobacco Use  . Smoking status: Never Smoker  . Smokeless tobacco: Never Used  Substance and Sexual Activity  . Alcohol use: No    Alcohol/week: 0.0 standard drinks  . Drug use: No  . Sexual activity: Not on file  Lifestyle  . Physical activity:    Days per week: Not on file    Minutes per session: Not on file  . Stress: Not on file  Relationships  . Social connections:    Talks on phone: Not on file    Gets together: Not on file    Attends religious service: Not on file    Active member of club or organization: Not on file  Attends meetings of clubs or organizations: Not on file    Relationship status: Not on file  Other Topics Concern  . Not on file  Social History Narrative  . Not on file    Current Outpatient Medications on File Prior to Visit  Medication Sig Dispense Refill  . alum & mag hydroxide-simeth (MAALOX/MYLANTA) 200-200-20 MG/5ML suspension Take 30 mLs by mouth as needed for indigestion or heartburn.    Marland Kitchen ascorbic acid (VITAMIN C) 1000 MG tablet Take 1,000 mg by mouth daily.    Marland Kitchen aspirin EC 81 MG tablet Take 81 mg by mouth daily.    . cholecalciferol (VITAMIN D) 1000 UNITS tablet Take 1,000 Units by mouth daily.    Marland Kitchen levothyroxine (SYNTHROID, LEVOTHROID) 100 MCG tablet Take 100 mcg by mouth daily before breakfast.    . lisinopril-hydrochlorothiazide (PRINZIDE,ZESTORETIC) 10-12.5 MG per tablet Take 1  tablet by mouth daily.    Marland Kitchen loratadine (CLARITIN) 10 MG tablet Take 10 mg by mouth daily.    . Multiple Vitamin (MULTIVITAMIN WITH MINERALS) TABS tablet Take 1 tablet by mouth daily.    . NON FORMULARY CPAP MACHINE    . Omega-3 Fatty Acids (FISH OIL) 1000 MG CAPS Take 2,000 mg by mouth daily.    . pantoprazole (PROTONIX) 40 MG tablet Take 40 mg by mouth daily.    . pravastatin (PRAVACHOL) 40 MG tablet Take 40 mg by mouth daily.    . Verapamil HCl CR 300 MG CP24 Take 300 mg by mouth daily.      No current facility-administered medications on file prior to visit.     Review of Systems: a complete, 10pt review of systems was completed with pertinent positives and negatives as documented in the HPI  Vitals Emeline Gins CMA; 06/12/2018 12:09 PM) 06/12/2018 12:09 PM Weight: 231.13 lb Height: 67in Body Surface Area: 2.15 m Body Mass Index: 36.2 kg/m  Temp.: 97.30F  Pulse: 88 (Regular)  BP: 150/80 (Sitting, Left Arm, Standard)  Physical Exam The physical exam findings are as follows: Note:Gen: alert and well appearing Eye: extraocular motion intact, no scleral icterus ENT: moist mucus membranes, dentition intact Neck: no mass or thyromegaly Chest: unlabored respirations, symmetrical air entry, clear bilaterally CV: regular rate and rhythm, no pedal edema Abdomen: soft, obese, nontender, nondistended. No mass or organomegaly. No surgical scars. MSK: strength symmetrical throughout, no deformity Neuro: grossly intact, normal gait Psych: normal mood and affect, appropriate insight Skin: warm and dry, no rash or lesion on limited exam  CBC Latest Ref Rng & Units 06/01/2018 04/07/2014  WBC 4.0 - 10.5 K/uL 5.6 9.4  Hemoglobin 12.0 - 15.0 g/dL 14.6 15.7(H)  Hematocrit 36.0 - 46.0 % 46.5(H) 45.7  Platelets 150 - 400 K/uL 87(L) 202    CMP Latest Ref Rng & Units 06/01/2018 04/07/2014  Glucose 70 - 99 mg/dL 120(H) 109(H)  BUN 8 - 23 mg/dL 18 22  Creatinine 0.44 - 1.00 mg/dL  1.21(H) 1.29(H)  Sodium 135 - 145 mmol/L 140 139  Potassium 3.5 - 5.1 mmol/L 3.9 4.6  Chloride 98 - 111 mmol/L 107 102  CO2 22 - 32 mmol/L 23 23  Calcium 8.9 - 10.3 mg/dL 10.1 9.7  Total Protein 6.5 - 8.1 g/dL 7.3 7.4  Total Bilirubin 0.3 - 1.2 mg/dL 1.1 1.0  Alkaline Phos 38 - 126 U/L 65 71  AST 15 - 41 U/L 35 30  ALT 0 - 44 U/L 32 34    Lab Results  Component Value Date   INR  0.91 04/07/2014    Imaging: No results found.    A/P: SYMPTOMATIC CHOLELITHIASIS (K80.20) Story: She has had what have sounded to be attacks of biliary colic with subxiphoid postprandial pain, we discussed that it is highly unusual for gallstones to cause pain on the left side of the abdomen where her most recent attack was, but given her history of symptoms and the abnormal appearance of the gallbladder on CT scan plus known stones it is not unreasonable to pursue cholecystectomy. She will be undergoing [place] tomorrow as well.  I discussed with her the technique of robotic/laparoscopic cholecystectomy with possible cholangiogram. Discussed risks of surgery including bleeding, pain, scarring, intraabdominal injury specifically to the common bile duct and sequelae, conversion to open surgery, failure to resolve symptoms, blood clots/ pulmonary embolus, heart attack, pneumonia, stroke, death. Questions welcomed and answered to patient's satisfaction. She would like to pursue surgery but would like to wait until after the new year which I think is completely reasonable given that she is asymptomatic at this time. We will request clearance from Dr. Theodosia Blender office.   Romana Juniper, MD [facility], [place] Pager [phone]

## 2018-06-13 ENCOUNTER — Ambulatory Visit
Admission: RE | Admit: 2018-06-13 | Discharge: 2018-06-13 | Disposition: A | Discharge status: Home / Self Care | Payer: Medicare Other | Source: Ambulatory Visit | Attending: Family Medicine | Admitting: Family Medicine

## 2018-06-13 ENCOUNTER — Telehealth: Payer: Self-pay | Admitting: *Deleted

## 2018-06-13 DIAGNOSIS — K8020 Calculus of gallbladder without cholecystitis without obstruction: Secondary | ICD-10-CM

## 2018-06-13 NOTE — Telephone Encounter (Signed)
   [facility] Pre-operative Risk Assessment    Request for surgical clearance:  1. What type of surgery is being performed? ROBOTIC LAP CHOLE   2. When is this surgery scheduled? TBD   3. Are there any medications that need to be held prior to surgery and how long? ASA   4. Practice name and name of physician performing surgery? [facility]   5. What is your office phone and fax number? PH# [phone]; FAX# [phone]   3. Anesthesia type (None, local, MAC, general) ? GENERAL    Julaine Hua 06/13/2018, 10:30 AM  _________________________________________________________________   (provider comments below)

## 2018-06-19 NOTE — Telephone Encounter (Signed)
Dr. Radford Pax pt need robotic lap chole, you just saw and echo fairly stable.  Ok to hold her ASA for 5 days for surgery?

## 2018-06-20 NOTE — Telephone Encounter (Signed)
I did not put her on ASA

## 2018-06-20 NOTE — Telephone Encounter (Signed)
   Primary Cardiologist: Armanda Magicraci Turner, MD  Chart reviewed as part of pre-operative protocol coverage. Patient was contacted 06/20/2018 in reference to pre-operative risk assessment for pending surgery as outlined below.  Tina Elliott was last seen on 06/06/18 by Dr. Mayford Knifeurner. She has h/o HTN, mild AS, mild OSA, obesity, diastolic dysfunction. Echo 06/2018 normal. Dr. Mayford Knifeurner did not plan any further cardiac workup after echo at this time. RCRI 0.4% indicating low risk of CV events. I spoke with patient who affirms no exertional angina or dyspnea. Able to complete >4 METS with stairs and walking. Therefore, based on ACC/AHA guidelines, the patient would be at acceptable risk for the planned procedure without further cardiovascular testing.   The patient indicates she thinks her PCP put her on aspirin. She is not on this for cardiac reasons so if clearance is needed to hold aspirin would suggest surgical team review with primary care if needed.  I will route this recommendation to the requesting party via Epic fax function and remove from pre-op pool.  Please call with questions.  Tina Montanaayna N Mitchelle Goerner, PA-C 06/20/2018, 2:30 PM

## 2018-07-11 NOTE — Progress Notes (Signed)
06-13-18 ( Epic) [facility], PAC  06-08-18 (Epic) ECHO  06-01-18 (Epic) CT angio chest/abd  05-15-18 (Epic) EKG

## 2018-07-11 NOTE — Patient Instructions (Addendum)
Chrissi Facenda Lazarz  07/11/2018   Your procedure is scheduled on: 07-24-18     Report to [facility] Main  Entrance    Report to Admitting at 5:30 AM    Call this number if you have problems the morning of surgery [phone]    Remember: Do not eat food or drink liquids :After Midnight.    BRUSH YOUR TEETH MORNING OF SURGERY AND RINSE YOUR MOUTH OUT, NO CHEWING GUM CANDY OR MINTS.     Take these medicines the morning of surgery with A SIP OF WATER: Loratadine (Claritin), Pantoprazole (Protonix), Pravastatin (Pravachol).                                 You may not have any metal on your body including hair pins and              piercings  Do not wear jewelry, make-up, lotions, powders or perfumes, deodorant             Do not wear nail polish.  Do not shave  48 hours prior to surgery.                Do not bring valuables to the hospital. [facility] IS NOT             RESPONSIBLE   FOR VALUABLES.  Contacts, dentures or bridgework may not be worn into surgery.  Leave suitcase in the car. After surgery it may be brought to your room.     Patients discharged the day of surgery will not be allowed to drive home. IF YOU ARE HAVING SURGERY AND GOING HOME THE SAME DAY, YOU MUST HAVE AN ADULT TO DRIVE YOU HOME AND BE WITH YOU FOR 24 HOURS. YOU MAY GO HOME BY TAXI OR UBER OR ORTHERWISE, BUT AN ADULT MUST ACCOMPANY YOU HOME AND STAY WITH YOU FOR 24 HOURS.   Name and phone number of your driver: Elliot Gurney [phone]  Special Instructions: Please bring your mask and tubing for your CPAP machine              Please read over the following fact sheets you were given: _____________________________________________________________________             [facility] - Preparing for Surgery Before surgery, you can play an important role.  Because skin is not sterile, your skin needs to be as free of germs as possible.  You can reduce the number of germs on your  skin by washing with CHG (chlorahexidine gluconate) soap before surgery.  CHG is an antiseptic cleaner which kills germs and bonds with the skin to continue killing germs even after washing. Please DO NOT use if you have an allergy to CHG or antibacterial soaps.  If your skin becomes reddened/irritated stop using the CHG and inform your nurse when you arrive at Short Stay. Do not shave (including legs and underarms) for at least 48 hours prior to the first CHG shower.  You may shave your face/neck. Please follow these instructions carefully:  1.  Shower with CHG Soap the night before surgery and the  morning of Surgery.  2.  If you choose to wash your hair, wash your hair first as usual with your  normal  shampoo.  3.  After you shampoo, rinse your hair and body thoroughly  to remove the  shampoo.                           4.  Use CHG as you would any other liquid soap.  You can apply chg directly  to the skin and wash                       Gently with a scrungie or clean washcloth.  5.  Apply the CHG Soap to your body ONLY FROM THE NECK DOWN.   Do not use on face/ open                           Wound or open sores. Avoid contact with eyes, ears mouth and genitals (private parts).                       Wash face,  Genitals (private parts) with your normal soap.             6.  Wash thoroughly, paying special attention to the area where your surgery  will be performed.  7.  Thoroughly rinse your body with warm water from the neck down.  8.  DO NOT shower/wash with your normal soap after using and rinsing off  the CHG Soap.                9.  Pat yourself dry with a clean towel.            10.  Wear clean pajamas.            11.  Place clean sheets on your bed the night of your first shower and do not  sleep with pets. Day of Surgery : Do not apply any lotions/deodorants the morning of surgery.  Please wear clean clothes to the hospital/surgery center.  FAILURE TO FOLLOW THESE INSTRUCTIONS MAY  RESULT IN THE CANCELLATION OF YOUR SURGERY PATIENT SIGNATURE_________________________________  NURSE SIGNATURE__________________________________  ________________________________________________________________________

## 2018-07-12 ENCOUNTER — Other Ambulatory Visit: Payer: Self-pay

## 2018-07-12 ENCOUNTER — Encounter (HOSPITAL_COMMUNITY): Payer: Self-pay

## 2018-07-12 ENCOUNTER — Encounter (HOSPITAL_COMMUNITY)
Admission: RE | Admit: 2018-07-12 | Discharge: 2018-07-12 | Disposition: A | Discharge status: Home / Self Care | Payer: Medicare Other | Source: Ambulatory Visit | Attending: Surgery | Admitting: Surgery

## 2018-07-12 DIAGNOSIS — Z01812 Encounter for preprocedural laboratory examination: Secondary | ICD-10-CM | POA: Insufficient documentation

## 2018-07-12 DIAGNOSIS — K8020 Calculus of gallbladder without cholecystitis without obstruction: Secondary | ICD-10-CM | POA: Insufficient documentation

## 2018-07-12 LAB — COMPREHENSIVE METABOLIC PANEL
ALBUMIN: 4.2 g/dL (ref 3.5–5.0)
ALT: 33 U/L (ref 0–44)
AST: 36 U/L (ref 15–41)
Alkaline Phosphatase: 60 U/L (ref 38–126)
Anion gap: 7 (ref 5–15)
BUN: 17 mg/dL (ref 8–23)
CO2: 26 mmol/L (ref 22–32)
Calcium: 9.8 mg/dL (ref 8.9–10.3)
Chloride: 108 mmol/L (ref 98–111)
Creatinine, Ser: 0.95 mg/dL (ref 0.44–1.00)
GFR calc Af Amer: >60 mL/min (ref >60)
GFR calc non Af Amer: 59 mL/min — ABNORMAL LOW (ref >60)
GLUCOSE: 110 mg/dL — AB (ref 70–99)
POTASSIUM: 4.3 mmol/L (ref 3.5–5.1)
SODIUM: 141 mmol/L (ref 135–145)
Total Bilirubin: 1.3 mg/dL — ABNORMAL HIGH (ref 0.3–1.2)
Total Protein: 7.2 g/dL (ref 6.5–8.1)

## 2018-07-12 LAB — CBC WITH DIFFERENTIAL/PLATELET
Abs Immature Granulocytes: 0 10*3/uL (ref 0.00–0.07)
BASOS ABS: 0.1 10*3/uL (ref 0.0–0.1)
Basophils Relative: 1 %
Eosinophils Absolute: 0.1 10*3/uL (ref 0.0–0.5)
Eosinophils Relative: 3 %
HCT: 44.1 % (ref 36.0–46.0)
Hemoglobin: 14 g/dL (ref 12.0–15.0)
Immature Granulocytes: 0 %
Lymphocytes Relative: 36 %
Lymphs Abs: 1.6 10*3/uL (ref 0.7–4.0)
MCH: 29.5 pg (ref 26.0–34.0)
MCHC: 31.7 g/dL (ref 30.0–36.0)
MCV: 92.8 fL (ref 80.0–100.0)
Monocytes Absolute: 0.5 10*3/uL (ref 0.1–1.0)
Monocytes Relative: 12 %
NRBC: 0 % (ref 0.0–0.2)
Neutro Abs: 2.1 10*3/uL (ref 1.7–7.7)
Neutrophils Relative %: 48 %
Platelets: 209 10*3/uL (ref 150–400)
RBC: 4.75 MIL/uL (ref 3.87–5.11)
RDW: 13.1 % (ref 11.5–15.5)
WBC: 4.4 10*3/uL (ref 4.0–10.5)

## 2018-07-16 NOTE — Anesthesia Preprocedure Evaluation (Addendum)
Anesthesia Evaluation  Patient identified by MRN, date of birth, ID band Patient awake    Reviewed: Allergy & Precautions, NPO status , Patient's Chart, lab work & pertinent test results  Airway Mallampati: II  TM Distance: <3 FB Neck ROM: Full    Dental  (+) Teeth Intact, Dental Advisory Given, Chipped,    Pulmonary sleep apnea ,    Pulmonary exam normal breath sounds clear to auscultation       Cardiovascular hypertension, Pt. on medications + Valvular Problems/Murmurs AS  Rhythm:Regular Rate:Normal + Systolic murmurs Echo 06/08/18 Study Conclusions  - Left ventricle: The cavity size was mildly dilated. Systolicfunction was normal. The estimated ejection fraction was in therange of 55% to 60%. Wall motion was normal; there were noregional wall motion abnormalities. Doppler parameters areconsistent with abnormal left ventricular relaxation (grade 1diastolic dysfunction). - Aortic valve: Moderately calcified annulus. Trileaflet;moderately thickened, moderately calcified leaflets. Valvemobility was restricted. Peak velocity (S): 216 cm/s. Meangradient (S): 9 mm Hg. Valve area (Vmax): 1.97 cm^2. - Mitral valve: Moderately calcified annulus. There was mildregurgitation.   Neuro/Psych negative neurological ROS  negative psych ROS   GI/Hepatic GERD  Medicated,Biliary colic    Endo/Other  Hypothyroidism Obesity   Renal/GU negative Renal ROS     Musculoskeletal negative musculoskeletal ROS (+)   Abdominal   Peds  Hematology negative hematology ROS (+)   Anesthesia Other Findings   Reproductive/Obstetrics negative OB ROS                           Anesthesia Physical Anesthesia Plan  ASA: II  Anesthesia Plan: General   Post-op Pain Management:    Induction: Intravenous  PONV Risk Score and Plan: 3 and Dexamethasone, Ondansetron and Treatment may vary due to age or medical  condition  Airway Management Planned: Oral ETT  Additional Equipment:   Intra-op Plan:   Post-operative Plan: Extubation in OR  Informed Consent: I have reviewed the patients History and Physical, chart, labs and discussed the procedure including the risks, benefits and alternatives for the proposed anesthesia with the patient or authorized representative who has indicated his/her understanding and acceptance.     Dental advisory given  Plan Discussed with: CRNA  Anesthesia Plan Comments:        Anesthesia Quick Evaluation

## 2018-07-16 NOTE — Progress Notes (Signed)
Anesthesia Chart Review   Case:  [id number] Date/Time:  07/24/18 0700   Procedure:  XI ROBOTIC ASSISTED CHOLECYSTECTOMY (N/A )   Anesthesia type:  General   Pre-op diagnosis:  Biliary colic   Location:  [facility] ROOM 02 / [facility]   Surgeon:  Berna Bueonnor, Chelsea A, MD      DISCUSSION: 73 yo never smoker with h/o HTN, hypothyroidism, Aortic stenosis, HLD, OSA with CPAP use, scheduled for above surgery on 07/24/2018 with Dr. Twana Firsthelsea Conner.    Pt is followed by cardiologist, Dr. Armanda Magicraci Turner, regarding OSA, HTN, and aortic stenosis.  She was last seen on 06/06/18.  Per Dr. Norris Crossurner's note she has mild AS with mean gradient 11mmHg.  Repeat echo with mean gradient of 9mmHg. Pt cleared by cardiology.  Per note by Ronie Spiesayna Dunn, PA-C on 06/20/18, " Echo 06/2018 normal. Dr. Mayford Knifeurner did not plan any further cardiac workup after echo at this time. RCRI 0.4% indicating low risk of CV events. I spoke with patient who affirms no exertional angina or dyspnea. Able to complete >4 METS with stairs and walking. Therefore, based on ACC/AHA guidelines, the patient would be at acceptable risk for the planned procedure without further cardiovascular testing."  Pt can proceed with planned procedure barring acute status change.   VS: BP (!) 141/56   Pulse 82   Temp 36.6 C (Oral)   Resp 18   Ht 5\' 7"  (1.702 m)   Wt 103 kg   SpO2 99%   BMI 35.55 kg/m   PROVIDERS: Juluis RainierBarnes, Elizabeth, MD is PCP  Armanda Magicurner, Traci, MD is cardiologist  LABS: Labs reviewed: Acceptable for surgery. (all labs ordered are listed, but only abnormal results are displayed)  Labs Reviewed  COMPREHENSIVE METABOLIC PANEL - Abnormal; Notable for the following components:      Result Value   Glucose, Bld 110 (*)    Total Bilirubin 1.3 (*)    GFR calc non Af Amer 59 (*)    All other components within normal limits  CBC WITH DIFFERENTIAL/PLATELET     IMAGES: CT Angio chest/Abdomen/Pelvis IMPRESSION: No evidence of aortic aneurysm or dissection. No  evidence of pulmonary embolus. Aortic atherosclerosis. No acute cardiopulmonary disease. Mild distention of the gallbladder with apparent mild wall thickening. At least 1 small stone visualized. If there is clinical concern for cholecystitis, consider further evaluation with ultrasound.  EKG: Rate 80 Sinus rhythm Low voltage, precordial leads  CV: Echo 06/08/18 Study Conclusions  - Left ventricle: The cavity size was mildly dilated. Systolic function was normal. The estimated ejection fraction was in the range of 55% to 60%. Wall motion was normal; there were no regional wall motion abnormalities. Doppler parameters are consistent with abnormal left ventricular relaxation (grade 1 diastolic dysfunction). - Aortic valve: Moderately calcified annulus. Trileaflet; moderately thickened, moderately calcified leaflets. Valve mobility was restricted. Peak velocity (S): 216 cm/s. Mean gradient (S): 9 mm Hg. Valve area (Vmax): 1.97 cm^2. - Mitral valve: Moderately calcified annulus. There was mild regurgitation.  Past Medical History:  Diagnosis Date  . Aortic stenosis    mild by echo 2017  . Celiac disease   . Diastolic dysfunction   . Hyperlipidemia   . Hypertension   . Hypothyroidism   . OSA (obstructive sleep apnea)    mild with AHI 9.58/hr on CPAP at 7cm H2O    Past Surgical History:  Procedure Laterality Date  . left knee sx    . TRIGGER FINGER RELEASE Left 2017    MEDICATIONS: .  ascorbic acid (VITAMIN C) 1000 MG tablet  . aspirin EC 81 MG tablet  . cholecalciferol (VITAMIN D) 1000 UNITS tablet  . levothyroxine (SYNTHROID, LEVOTHROID) 100 MCG tablet  . lisinopril-hydrochlorothiazide (PRINZIDE,ZESTORETIC) 10-12.5 MG per tablet  . loratadine (CLARITIN) 10 MG tablet  . NON FORMULARY  . Omega-3 Fatty Acids (FISH OIL) 1000 MG CAPS  . pantoprazole (PROTONIX) 40 MG tablet  . pravastatin (PRAVACHOL) 40 MG tablet  . Verapamil HCl CR 300 MG CP24   No current  facility-administered medications for this encounter.      Janey GentaJessica Nicasio Barlowe, PA-C [facility] Pre[facility]Surgical Testing [phone] 07/16/18 11:56 AM

## 2018-07-23 MED ORDER — BUPIVACAINE LIPOSOME 1.3 % IJ SUSP
20.0000 mL | Freq: Once | INTRAMUSCULAR | Status: DC
  Filled 2018-07-23: qty 20

## 2018-07-23 NOTE — H&P (Signed)
Surgical H&P  CC: abdominal/ chest pain  HPI: This is a very pleasant 73 year old woman is referred for discussion of cholecystectomy following an abnormal appearance of her gallbladder on CT scan. She presented to the emergency room on November 29 with acute onset severe pressure-like pain in the left lower chest and subcostal region radiating to the back. This woke her from sleep and was not related to inactivity. This, of course, was the morning after Thanksgiving. She underwent a CTA as there was concern for dissection and this revealed no pathology to explain her symptoms, however she was noted to have a gallbladder that was mildly distended with a mildly thickened wall and likely stone. She has since seen Dr. Radford Pax in cardiology and it is felt that her symptoms were not cardiac in etiology. She has had 2 previous attacks of pain like this, but they were more centrally in the upper abdomen and lower chest. With each of these attacks, she feels that the worse of the pain is in her back, she feels the muscles in her back again tightened and will not relax. She denies any nausea, queasiness or fever associated with these attacks. She does see Dr. Michail Sermon for reflux, she states she has had an endoscopy recently which was negative for hiatal hernia or other abnormality but that she does take Protonix. She states that she is overdue for colonoscopy       Allergies  Allergen Reactions  . Wheat Bran Anaphylaxis  . Celebrex [Celecoxib] Other (See Comments)    GI issues  . Gluten Meal Other (See Comments)    Celiac disease        Past Medical History:  Diagnosis Date  . Aortic stenosis    mild by echo 2017  . Celiac disease   . Diastolic dysfunction   . Hyperlipidemia   . Hypertension   . Hypothyroidism   . OSA (obstructive sleep apnea)    mild with AHI 9.58/hr on CPAP at 7cm H2O         Past Surgical History:  Procedure Laterality Date  . left knee sx            Family History  Problem Relation Age of Onset  . Heart attack Mother   . Heart attack Father   . AAA (abdominal aortic aneurysm) Brother     Social History        Socioeconomic History  . Marital status: Married    Spouse name: Not on file  . Number of children: Not on file  . Years of education: Not on file  . Highest education level: Not on file  Occupational History  . Not on file  Social Needs  . Financial resource strain: Not on file  . Food insecurity:    Worry: Not on file    Inability: Not on file  . Transportation needs:    Medical: Not on file    Non-medical: Not on file  Tobacco Use  . Smoking status: Never Smoker  . Smokeless tobacco: Never Used  Substance and Sexual Activity  . Alcohol use: No    Alcohol/week: 0.0 standard drinks  . Drug use: No  . Sexual activity: Not on file  Lifestyle  . Physical activity:    Days per week: Not on file    Minutes per session: Not on file  . Stress: Not on file  Relationships  . Social connections:    Talks on phone: Not on file    Gets together:  Not on file    Attends religious service: Not on file    Active member of club or organization: Not on file    Attends meetings of clubs or organizations: Not on file    Relationship status: Not on file  Other Topics Concern  . Not on file  Social History Narrative  . Not on file          Current Outpatient Medications on File Prior to Visit  Medication Sig Dispense Refill  . alum & mag hydroxide-simeth (MAALOX/MYLANTA) 200-200-20 MG/5ML suspension Take 30 mLs by mouth as needed for indigestion or heartburn.    Marland Kitchen ascorbic acid (VITAMIN C) 1000 MG tablet Take 1,000 mg by mouth daily.    Marland Kitchen aspirin EC 81 MG tablet Take 81 mg by mouth daily.    . cholecalciferol (VITAMIN D) 1000 UNITS tablet Take 1,000 Units by mouth daily.    Marland Kitchen levothyroxine (SYNTHROID, LEVOTHROID) 100 MCG tablet Take 100 mcg by mouth daily  before breakfast.    . lisinopril-hydrochlorothiazide (PRINZIDE,ZESTORETIC) 10-12.5 MG per tablet Take 1 tablet by mouth daily.    Marland Kitchen loratadine (CLARITIN) 10 MG tablet Take 10 mg by mouth daily.    . Multiple Vitamin (MULTIVITAMIN WITH MINERALS) TABS tablet Take 1 tablet by mouth daily.    . NON FORMULARY CPAP MACHINE    . Omega-3 Fatty Acids (FISH OIL) 1000 MG CAPS Take 2,000 mg by mouth daily.    . pantoprazole (PROTONIX) 40 MG tablet Take 40 mg by mouth daily.    . pravastatin (PRAVACHOL) 40 MG tablet Take 40 mg by mouth daily.    . Verapamil HCl CR 300 MG CP24 Take 300 mg by mouth daily.      No current facility-administered medications on file prior to visit.     Review of Systems: a complete, 10pt review of systems was completed with pertinent positives and negatives as documented in the HPI  Vitals Emeline Gins CMA; 06/12/2018 12:09 PM) 06/12/2018 12:09 PM Weight: 231.13 lb Height: 67in Body Surface Area: 2.15 m Body Mass Index: 36.2 kg/m  Temp.: 97.49F  Pulse: 88 (Regular)  BP: 150/80 (Sitting, Left Arm, Standard)  Physical Exam The physical exam findings are as follows: Note:Gen: alert and well appearing Eye: extraocular motion intact, no scleral icterus ENT: moist mucus membranes, dentition intact Neck: no mass or thyromegaly Chest: unlabored respirations, symmetrical air entry, clear bilaterally CV: regular rate and rhythm, no pedal edema Abdomen: soft, obese, nontender, nondistended. No mass or organomegaly. No surgical scars. MSK: strength symmetrical throughout, no deformity Neuro: grossly intact, normal gait Psych: normal mood and affect, appropriate insight Skin: warm and dry, no rash or lesion on limited exam  CBC Latest Ref Rng & Units 06/01/2018 04/07/2014  WBC 4.0 - 10.5 K/uL 5.6 9.4  Hemoglobin 12.0 - 15.0 g/dL 14.6 15.7(H)  Hematocrit 36.0 - 46.0 % 46.5(H) 45.7  Platelets 150 - 400 K/uL 87(L) 202    CMP  Latest Ref Rng & Units 06/01/2018 04/07/2014  Glucose 70 - 99 mg/dL 120(H) 109(H)  BUN 8 - 23 mg/dL 18 22  Creatinine 0.44 - 1.00 mg/dL 1.21(H) 1.29(H)  Sodium 135 - 145 mmol/L 140 139  Potassium 3.5 - 5.1 mmol/L 3.9 4.6  Chloride 98 - 111 mmol/L 107 102  CO2 22 - 32 mmol/L 23 23  Calcium 8.9 - 10.3 mg/dL 10.1 9.7  Total Protein 6.5 - 8.1 g/dL 7.3 7.4  Total Bilirubin 0.3 - 1.2 mg/dL 1.1 1.0  Alkaline Phos 38 -  126 U/L 65 71  AST 15 - 41 U/L 35 30  ALT 0 - 44 U/L 32 34    RecentLabs       Lab Results  Component Value Date   INR 0.91 04/07/2014      Imaging: ImagingResults(Last48hours)  No results found.      A/P: SYMPTOMATIC CHOLELITHIASIS (K80.20) Story: She has had what have sounded to be attacks of biliary colic with subxiphoid postprandial pain, we discussed that it is highly unusual for gallstones to cause pain on the left side of the abdomen where her most recent attack was, but given her history of symptoms and the abnormal appearance of the gallbladder on CT scan plus known stones it is not unreasonable to pursue cholecystectomy. She will be undergoing [place] tomorrow as well.  I discussed with her the technique of robotic/laparoscopic cholecystectomy with possible cholangiogram. Discussed risks of surgery including bleeding, pain, scarring, intraabdominal injury specifically to the common bile duct and sequelae, conversion to open surgery, failure to resolve symptoms, blood clots/ pulmonary embolus, heart attack, pneumonia, stroke, death. Questions welcomed and answered to patient's satisfaction. She would like to pursue surgery but would like to wait until after the new year which I think is completely reasonable given that she is asymptomatic at this time. We will request clearance from Dr. Theodosia Blender office.   Romana Juniper, MD [facility], [place] Pager [phone]

## 2018-07-24 ENCOUNTER — Ambulatory Visit (HOSPITAL_COMMUNITY): Payer: Medicare Other | Admitting: Anesthesiology

## 2018-07-24 ENCOUNTER — Encounter (HOSPITAL_COMMUNITY)
Admission: RE | Disposition: A | Discharge status: Home / Self Care | Payer: Self-pay | Source: Home / Self Care | Attending: Surgery

## 2018-07-24 ENCOUNTER — Ambulatory Visit (HOSPITAL_COMMUNITY)
Admission: RE | Admit: 2018-07-24 | Discharge: 2018-07-24 | Disposition: A | Discharge status: Home / Self Care | Payer: Medicare Other | Attending: Surgery | Admitting: Surgery

## 2018-07-24 ENCOUNTER — Encounter (HOSPITAL_COMMUNITY): Payer: Self-pay | Admitting: Emergency Medicine

## 2018-07-24 ENCOUNTER — Ambulatory Visit (HOSPITAL_COMMUNITY): Payer: Medicare Other | Admitting: Physician Assistant

## 2018-07-24 DIAGNOSIS — Z886 Allergy status to analgesic agent status: Secondary | ICD-10-CM | POA: Diagnosis not present

## 2018-07-24 DIAGNOSIS — G4733 Obstructive sleep apnea (adult) (pediatric): Secondary | ICD-10-CM | POA: Diagnosis not present

## 2018-07-24 DIAGNOSIS — K219 Gastro-esophageal reflux disease without esophagitis: Secondary | ICD-10-CM | POA: Diagnosis not present

## 2018-07-24 DIAGNOSIS — Z7982 Long term (current) use of aspirin: Secondary | ICD-10-CM | POA: Diagnosis not present

## 2018-07-24 DIAGNOSIS — Z79899 Other long term (current) drug therapy: Secondary | ICD-10-CM | POA: Diagnosis not present

## 2018-07-24 DIAGNOSIS — K900 Celiac disease: Secondary | ICD-10-CM | POA: Insufficient documentation

## 2018-07-24 DIAGNOSIS — I10 Essential (primary) hypertension: Secondary | ICD-10-CM | POA: Diagnosis not present

## 2018-07-24 DIAGNOSIS — Z6835 Body mass index (BMI) 35.0-35.9, adult: Secondary | ICD-10-CM | POA: Insufficient documentation

## 2018-07-24 DIAGNOSIS — K811 Chronic cholecystitis: Secondary | ICD-10-CM | POA: Diagnosis present

## 2018-07-24 DIAGNOSIS — E785 Hyperlipidemia, unspecified: Secondary | ICD-10-CM | POA: Diagnosis not present

## 2018-07-24 DIAGNOSIS — E669 Obesity, unspecified: Secondary | ICD-10-CM | POA: Insufficient documentation

## 2018-07-24 DIAGNOSIS — I499 Cardiac arrhythmia, unspecified: Secondary | ICD-10-CM | POA: Insufficient documentation

## 2018-07-24 DIAGNOSIS — Z91018 Allergy to other foods: Secondary | ICD-10-CM | POA: Insufficient documentation

## 2018-07-24 DIAGNOSIS — I350 Nonrheumatic aortic (valve) stenosis: Secondary | ICD-10-CM | POA: Diagnosis not present

## 2018-07-24 DIAGNOSIS — E039 Hypothyroidism, unspecified: Secondary | ICD-10-CM | POA: Insufficient documentation

## 2018-07-24 SURGERY — CHOLECYSTECTOMY, ROBOT-ASSISTED, LAPAROSCOPIC
Anesthesia: General | Site: Abdomen

## 2018-07-24 MED ORDER — INDOCYANINE GREEN 25 MG IV SOLR
2.5000 mg | Freq: Once | INTRAVENOUS | Status: AC
  Administered 2018-07-24: 2.5 mg via INTRAVENOUS
  Filled 2018-07-24: qty 25

## 2018-07-24 MED ORDER — LACTATED RINGERS IR SOLN
Status: DC | PRN
  Administered 2018-07-24: 1000 mL

## 2018-07-24 MED ORDER — SODIUM CHLORIDE 0.9% FLUSH: 3.0000 mL | INTRAVENOUS | Status: DC | PRN

## 2018-07-24 MED ORDER — OXYCODONE HCL 5 MG PO TABS
5.0000 mg | ORAL_TABLET | ORAL | Status: DC | PRN
  Administered 2018-07-24: 5 mg via ORAL

## 2018-07-24 MED ORDER — TRAMADOL HCL 50 MG PO TABS: 50.0000 mg | ORAL_TABLET | Freq: Four times a day (QID) | ORAL | 0 refills | Status: DC | PRN

## 2018-07-24 MED ORDER — PHENYLEPHRINE HCL 10 MG/ML IJ SOLN
INTRAMUSCULAR | Status: AC
  Filled 2018-07-24: qty 1

## 2018-07-24 MED ORDER — BUPIVACAINE-EPINEPHRINE 0.25% -1:200000 IJ SOLN
INTRAMUSCULAR | Status: DC | PRN
  Administered 2018-07-24: 30 mL

## 2018-07-24 MED ORDER — EPHEDRINE 5 MG/ML INJ
INTRAVENOUS | Status: AC
  Filled 2018-07-24: qty 10

## 2018-07-24 MED ORDER — PROPOFOL 10 MG/ML IV BOLUS
INTRAVENOUS | Status: DC | PRN
  Administered 2018-07-24: 120 mg via INTRAVENOUS

## 2018-07-24 MED ORDER — CHLORHEXIDINE GLUCONATE 4 % EX LIQD: 60.0000 mL | Freq: Once | CUTANEOUS | Status: DC

## 2018-07-24 MED ORDER — SUGAMMADEX SODIUM 200 MG/2ML IV SOLN
INTRAVENOUS | Status: AC
  Filled 2018-07-24: qty 2

## 2018-07-24 MED ORDER — FENTANYL CITRATE (PF) 100 MCG/2ML IJ SOLN: 25.0000 ug | INTRAMUSCULAR | Status: DC | PRN

## 2018-07-24 MED ORDER — ACETAMINOPHEN 325 MG PO TABS: 650.0000 mg | ORAL_TABLET | ORAL | Status: DC | PRN

## 2018-07-24 MED ORDER — ONDANSETRON HCL 4 MG/2ML IJ SOLN
INTRAMUSCULAR | Status: AC
  Filled 2018-07-24: qty 2

## 2018-07-24 MED ORDER — DOCUSATE SODIUM 100 MG PO CAPS: 100.0000 mg | ORAL_CAPSULE | Freq: Two times a day (BID) | ORAL | 0 refills | Status: AC

## 2018-07-24 MED ORDER — ROCURONIUM BROMIDE 100 MG/10ML IV SOLN
INTRAVENOUS | Status: DC | PRN
  Administered 2018-07-24: 50 mg via INTRAVENOUS
  Administered 2018-07-24 (×2): 10 mg via INTRAVENOUS

## 2018-07-24 MED ORDER — GABAPENTIN 300 MG PO CAPS
300.0000 mg | ORAL_CAPSULE | ORAL | Status: AC
  Administered 2018-07-24: 300 mg via ORAL
  Filled 2018-07-24: qty 1

## 2018-07-24 MED ORDER — PHENYLEPHRINE 40 MCG/ML (10ML) SYRINGE FOR IV PUSH (FOR BLOOD PRESSURE SUPPORT)
PREFILLED_SYRINGE | INTRAVENOUS | Status: AC
  Filled 2018-07-24: qty 10

## 2018-07-24 MED ORDER — BUPIVACAINE-EPINEPHRINE (PF) 0.25% -1:200000 IJ SOLN
INTRAMUSCULAR | Status: AC
  Filled 2018-07-24: qty 30

## 2018-07-24 MED ORDER — FENTANYL CITRATE (PF) 100 MCG/2ML IJ SOLN
INTRAMUSCULAR | Status: DC | PRN
  Administered 2018-07-24: 50 ug via INTRAVENOUS
  Administered 2018-07-24: 25 ug via INTRAVENOUS
  Administered 2018-07-24: 50 ug via INTRAVENOUS
  Administered 2018-07-24 (×2): 25 ug via INTRAVENOUS
  Administered 2018-07-24: 50 ug via INTRAVENOUS
  Administered 2018-07-24: 25 ug via INTRAVENOUS

## 2018-07-24 MED ORDER — CEFAZOLIN SODIUM-DEXTROSE 2-4 GM/100ML-% IV SOLN
2.0000 g | INTRAVENOUS | Status: AC
  Administered 2018-07-24: 2 g via INTRAVENOUS
  Filled 2018-07-24: qty 100

## 2018-07-24 MED ORDER — FENTANYL CITRATE (PF) 100 MCG/2ML IJ SOLN
INTRAMUSCULAR | Status: AC
  Filled 2018-07-24: qty 2

## 2018-07-24 MED ORDER — ALBUTEROL SULFATE (2.5 MG/3ML) 0.083% IN NEBU
INHALATION_SOLUTION | RESPIRATORY_TRACT | Status: AC
  Filled 2018-07-24: qty 3

## 2018-07-24 MED ORDER — EPHEDRINE SULFATE 50 MG/ML IJ SOLN
INTRAMUSCULAR | Status: DC | PRN
  Administered 2018-07-24: 20 mg via INTRAVENOUS
  Administered 2018-07-24: 5 mg via INTRAVENOUS
  Administered 2018-07-24: 10 mg via INTRAVENOUS

## 2018-07-24 MED ORDER — TISSEEL VH 10 ML EX KIT
PACK | CUTANEOUS | Status: AC
  Filled 2018-07-24: qty 1

## 2018-07-24 MED ORDER — ACETAMINOPHEN 650 MG RE SUPP
650.0000 mg | RECTAL | Status: DC | PRN
  Filled 2018-07-24: qty 1

## 2018-07-24 MED ORDER — SODIUM CHLORIDE 0.9% FLUSH: 3.0000 mL | Freq: Two times a day (BID) | INTRAVENOUS | Status: DC

## 2018-07-24 MED ORDER — LIDOCAINE HCL 2 % IJ SOLN
INTRAMUSCULAR | Status: AC
  Filled 2018-07-24: qty 20

## 2018-07-24 MED ORDER — SUGAMMADEX SODIUM 500 MG/5ML IV SOLN
INTRAVENOUS | Status: DC | PRN
  Administered 2018-07-24: 300 mg via INTRAVENOUS

## 2018-07-24 MED ORDER — 0.9 % SODIUM CHLORIDE (POUR BTL) OPTIME
TOPICAL | Status: DC | PRN
  Administered 2018-07-24: 1000 mL

## 2018-07-24 MED ORDER — SODIUM CHLORIDE 0.9 % IV SOLN
INTRAVENOUS | Status: DC | PRN
  Administered 2018-07-24: 25 ug/min via INTRAVENOUS

## 2018-07-24 MED ORDER — FENTANYL CITRATE (PF) 250 MCG/5ML IJ SOLN
INTRAMUSCULAR | Status: AC
  Filled 2018-07-24: qty 5

## 2018-07-24 MED ORDER — ACETAMINOPHEN 500 MG PO TABS
1000.0000 mg | ORAL_TABLET | ORAL | Status: AC
  Administered 2018-07-24: 1000 mg via ORAL
  Filled 2018-07-24: qty 2

## 2018-07-24 MED ORDER — LIDOCAINE 20MG/ML (2%) 15 ML SYRINGE OPTIME
INTRAMUSCULAR | Status: DC | PRN
  Administered 2018-07-24: 1.5 mg/kg/h via INTRAVENOUS

## 2018-07-24 MED ORDER — ONDANSETRON HCL 4 MG/2ML IJ SOLN: 4.0000 mg | Freq: Once | INTRAMUSCULAR | Status: DC | PRN

## 2018-07-24 MED ORDER — ONDANSETRON HCL 4 MG/2ML IJ SOLN
INTRAMUSCULAR | Status: DC | PRN
  Administered 2018-07-24: 4 mg via INTRAVENOUS

## 2018-07-24 MED ORDER — LIDOCAINE HCL (CARDIAC) PF 100 MG/5ML IV SOSY
PREFILLED_SYRINGE | INTRAVENOUS | Status: DC | PRN
  Administered 2018-07-24: 80 mg via INTRAVENOUS

## 2018-07-24 MED ORDER — BUPIVACAINE LIPOSOME 1.3 % IJ SUSP
INTRAMUSCULAR | Status: DC | PRN
  Administered 2018-07-24: 20 mL

## 2018-07-24 MED ORDER — DEXAMETHASONE SODIUM PHOSPHATE 10 MG/ML IJ SOLN
INTRAMUSCULAR | Status: DC | PRN
  Administered 2018-07-24: 4 mg via INTRAVENOUS

## 2018-07-24 MED ORDER — PHENYLEPHRINE HCL 10 MG/ML IJ SOLN
INTRAMUSCULAR | Status: DC | PRN
  Administered 2018-07-24: 80 ug via INTRAVENOUS

## 2018-07-24 MED ORDER — LACTATED RINGERS IV SOLN
INTRAVENOUS | Status: DC | PRN
  Administered 2018-07-24: 07:00:00 via INTRAVENOUS

## 2018-07-24 MED ORDER — SODIUM CHLORIDE 0.9 % IV SOLN: 250.0000 mL | INTRAVENOUS | Status: DC | PRN

## 2018-07-24 MED ORDER — ROCURONIUM BROMIDE 100 MG/10ML IV SOLN
INTRAVENOUS | Status: AC
  Filled 2018-07-24: qty 1

## 2018-07-24 MED ORDER — OXYCODONE HCL 5 MG PO TABS
ORAL_TABLET | ORAL | Status: AC
  Filled 2018-07-24: qty 1

## 2018-07-24 MED ORDER — FENTANYL CITRATE (PF) 100 MCG/2ML IJ SOLN
25.0000 ug | INTRAMUSCULAR | Status: DC | PRN
  Administered 2018-07-24 (×2): 50 ug via INTRAVENOUS

## 2018-07-24 MED ORDER — PROPOFOL 10 MG/ML IV BOLUS
INTRAVENOUS | Status: AC
  Filled 2018-07-24: qty 20

## 2018-07-24 SURGICAL SUPPLY — 54 items
APPLIER CLIP 5 13 M/L LIGAMAX5 (MISCELLANEOUS)
APPLIER CLIP ROT 10 11.4 M/L (STAPLE)
BLADE SURG SZ11 CARB STEEL (BLADE) ×3 IMPLANT
CHLORAPREP W/TINT 26ML (MISCELLANEOUS) ×3 IMPLANT
CLIP APPLIE 5 13 M/L LIGAMAX5 (MISCELLANEOUS) IMPLANT
CLIP APPLIE ROT 10 11.4 M/L (STAPLE) IMPLANT
CLIP VESOLOCK LG 6/CT PURPLE (CLIP) IMPLANT
CLIP VESOLOCK MED LG 6/CT (CLIP) ×3 IMPLANT
COVER TIP SHEARS 8 DVNC (MISCELLANEOUS) ×1 IMPLANT
COVER TIP SHEARS 8MM DA VINCI (MISCELLANEOUS) ×2
COVER WAND RF STERILE (DRAPES) ×3 IMPLANT
DECANTER SPIKE VIAL GLASS SM (MISCELLANEOUS) IMPLANT
DERMABOND ADVANCED (GAUZE/BANDAGES/DRESSINGS) ×2
DERMABOND ADVANCED .7 DNX12 (GAUZE/BANDAGES/DRESSINGS) ×1 IMPLANT
DEVICE TROCAR PUNCTURE CLOSURE (ENDOMECHANICALS) IMPLANT
DRAPE ARM DVNC X/XI (DISPOSABLE) ×4 IMPLANT
DRAPE COLUMN DVNC XI (DISPOSABLE) ×1 IMPLANT
DRAPE DA VINCI XI ARM (DISPOSABLE) ×8
DRAPE DA VINCI XI COLUMN (DISPOSABLE) ×2
ELECT REM PT RETURN 15FT ADLT (MISCELLANEOUS) ×3 IMPLANT
GAUZE 4X4 16PLY RFD (DISPOSABLE) ×3 IMPLANT
GAUZE SPONGE 2X2 8PLY STRL LF (GAUZE/BANDAGES/DRESSINGS) IMPLANT
GLOVE BIO SURGEON STRL SZ 6 (GLOVE) ×6 IMPLANT
GLOVE INDICATOR 6.5 STRL GRN (GLOVE) ×6 IMPLANT
GRASPER SUT TROCAR 14GX15 (MISCELLANEOUS) IMPLANT
KIT BASIN OR (CUSTOM PROCEDURE TRAY) ×3 IMPLANT
MANIFOLD NEPTUNE II (INSTRUMENTS) ×3 IMPLANT
MARKER SKIN DUAL TIP RULER LAB (MISCELLANEOUS) ×3 IMPLANT
NEEDLE HYPO 22GX1.5 SAFETY (NEEDLE) ×3 IMPLANT
OBTURATOR OPTICAL STANDARD 8MM (TROCAR)
OBTURATOR OPTICAL STND 8 DVNC (TROCAR)
OBTURATOR OPTICALSTD 8 DVNC (TROCAR) IMPLANT
PACK CARDIOVASCULAR III (CUSTOM PROCEDURE TRAY) IMPLANT
PACK UNIVERSAL I (CUSTOM PROCEDURE TRAY) ×3 IMPLANT
POUCH SPECIMEN RETRIEVAL 10MM (ENDOMECHANICALS) IMPLANT
SCISSORS LAP 5X35 DISP (ENDOMECHANICALS) IMPLANT
SEAL CANN UNIV 5-8 DVNC XI (MISCELLANEOUS) ×3 IMPLANT
SEAL XI 5MM-8MM UNIVERSAL (MISCELLANEOUS) ×6
SEALER VESSEL DA VINCI XI (MISCELLANEOUS)
SEALER VESSEL EXT DVNC XI (MISCELLANEOUS) IMPLANT
SET BI-LUMEN FLTR TB AIRSEAL (TUBING) IMPLANT
SET IRRIG TUBING LAPAROSCOPIC (IRRIGATION / IRRIGATOR) ×3 IMPLANT
SLEEVE XCEL OPT CAN 5 100 (ENDOMECHANICALS) IMPLANT
SOLUTION ANTI FOG 6CC (MISCELLANEOUS) ×3 IMPLANT
SPONGE GAUZE 2X2 STER 10/PKG (GAUZE/BANDAGES/DRESSINGS)
STAPLER VISISTAT 35W (STAPLE) IMPLANT
SUT MNCRL AB 4-0 PS2 18 (SUTURE) ×3 IMPLANT
SYR CONTROL 10ML LL (SYRINGE) ×3 IMPLANT
SYS RETRIEVAL 5MM INZII UNIV (BASKET) ×3
SYSTEM RETRIEVL 5MM INZII UNIV (BASKET) ×1 IMPLANT
TOWEL OR 17X26 10 PK STRL BLUE (TOWEL DISPOSABLE) ×3 IMPLANT
TOWEL OR NON WOVEN STRL DISP B (DISPOSABLE) ×3 IMPLANT
TROCAR BLADELESS OPT 5 100 (ENDOMECHANICALS) ×3 IMPLANT
TUBING INSUFFLATION 10FT LAP (TUBING) ×3 IMPLANT

## 2018-07-24 NOTE — Op Note (Signed)
Operative Note  Tina Elliott 73 y.o. female [id number]  07/24/2018  Surgeon: Clovis Riley MD  Assistant: Gurney Maxin MD  Procedure performed: Robotic Cholecystectomy  Preop diagnosis: cholecystitis/ biliary colic Post-op diagnosis/intraop findings: same  Specimens: gallbladder  EBL: minimal  Complications: none  Description of procedure: After obtaining informed consent the patient was brought to the operating room. Prophylactic antibiotics were administered. SCD's were applied. General endotracheal anesthesia was initiated and a formal time-out was performed. The abdomen was prepped and draped in the usual sterile fashion and the abdomen was entered using visiport technique in the left upper quadrant after instilling the site with local. Insufflation to 42mHg was obtained and gross inspection revealed no evidence of injury from our entry or other intraabdominal abnormalities. 83mtrocars were introduced in the periumbilical, right midclavicular and right anterior axillary lines under direct visualization and following infiltration with local. Entry port was changed to an 69m9mort. The gallbladder was retracted cephalad and the infundibulum was retracted laterally. A combination of hook electrocautery and blunt dissection was utilized to clear the peritoneum from the neck and cystic duct, circumferentially isolating the cystic artery and cystic duct and lifting the gallbladder from the cystic plate. The critical view of safety was achieved with the cystic artery, cystic duct, and liver bed visualized between them with no other structures. Firefly was employed and confirmed appropriate anatomy. The artery was clipped with a single hemolock clip proximally and distally and divided as was the cystic duct with two clips on the proximal end. The gallbladder was dissected from the liver plate using electrocautery. It did tear due to retraction during this with spillage of clear bile, no  stones. Once freed the gallbladder was placed in an endocatch bag and removed through the epigastric trocar site. A small amount of bleeding on the liver bed was controlled with cautery. The bile spilled from the gallbladder during its dissection from the liver bed was aspirated. The right upper quadrant was irrigated copiously and the effluent was clear. Firefly was again employed and no bile leak from the liver bed was present. Hemostasis was once again confirmed, and reinspection of the abdomen revealed no injuries. The clips were well opposed without any bile leak from the duct or the liver bed. The abdomen was desufflated and all trocars removed. The skin incisions were closed with running subcuticular monocryl and Dermabond. The patient was awakened, extubated and transported to the recovery room in stable condition.   All counts were correct at the completion of the case.

## 2018-07-24 NOTE — Transfer of Care (Signed)
Immediate Anesthesia Transfer of Care Note  Patient: Tina Elliott  Procedure(s) Performed: XI ROBOTIC CHOLECYSTECTOMY (N/A Abdomen)  Patient Location: PACU  Anesthesia Type:General  Level of Consciousness: awake, alert  and oriented  Airway & Oxygen Therapy: Patient Spontanous Breathing and Patient connected to face mask oxygen  Post-op Assessment: Report given to RN and Post -op Vital signs reviewed and stable  Post vital signs: Reviewed and stable  Last Vitals:  Vitals Value Taken Time  BP 141/73 07/24/2018  9:15 AM  Temp    Pulse 84 07/24/2018  9:17 AM  Resp 18 07/24/2018  9:17 AM  SpO2 100 % 07/24/2018  9:17 AM  Vitals shown include unvalidated device data.  Last Pain:  Vitals:   07/24/18 0614  TempSrc:   PainSc: 0-No pain      Patients Stated Pain Goal: 4 (07/24/18 4497)  Complications: No apparent anesthesia complications

## 2018-07-24 NOTE — Anesthesia Postprocedure Evaluation (Signed)
Anesthesia Post Note  Patient: Lylee Corrow Nevel  Procedure(s) Performed: XI ROBOTIC ASSISTED CHOLECYSTECTOMY (N/A Abdomen)     Patient location during evaluation: PACU Anesthesia Type: General Level of consciousness: awake and alert Pain management: pain level controlled Vital Signs Assessment: post-procedure vital signs reviewed and stable Respiratory status: spontaneous breathing, nonlabored ventilation, respiratory function stable and patient connected to nasal cannula oxygen Cardiovascular status: blood pressure returned to baseline and stable Postop Assessment: no apparent nausea or vomiting Anesthetic complications: no    Last Vitals:  Vitals:   07/24/18 1047 07/24/18 1157  BP: (!) 153/77 (!) 157/78  Pulse: 83 95  Resp: 14 16  Temp: (!) 36.3 C   SpO2: 97% 96%    Last Pain:  Vitals:   07/24/18 1157  TempSrc:   PainSc: 3                  Catalina Gravel

## 2018-07-24 NOTE — Anesthesia Procedure Notes (Signed)
Procedure Name: Intubation Date/Time: 07/24/2018 7:34 AM Performed by: Thornell Mule, CRNA Pre-anesthesia Checklist: Patient identified, Emergency Drugs available, Suction available and Patient being monitored Patient Re-evaluated:Patient Re-evaluated prior to induction Oxygen Delivery Method: Circle system utilized Preoxygenation: Pre-oxygenation with 100% oxygen Induction Type: IV induction Ventilation: Mask ventilation without difficulty Laryngoscope Size: Miller and 3 Tube type: Oral Number of attempts: 1 Airway Equipment and Method: Stylet and Oral airway Placement Confirmation: ETT inserted through vocal cords under direct vision,  positive ETCO2 and breath sounds checked- equal and bilateral Tube secured with: Tape Dental Injury: Teeth and Oropharynx as per pre-operative assessment

## 2018-07-24 NOTE — Anesthesia Procedure Notes (Signed)
Date/Time: 07/24/2018 9:02 AM Performed by: Thornell Mule, CRNA Oxygen Delivery Method: Simple face mask Placement Confirmation: positive ETCO2 and breath sounds checked- equal and bilateral Dental Injury: Teeth and Oropharynx as per pre-operative assessment

## 2018-07-24 NOTE — Discharge Instructions (Signed)
POST OP INSTRUCTIONS  ######################################################################  EAT Gradually transition to a high fiber diet with a fiber supplement over the next few weeks after discharge.  Start with a pureed / full liquid diet (see below)  WALK Walk an hour a day.  Control your pain to do that.    CONTROL PAIN Control pain so that you can walk, sleep, tolerate sneezing/coughing, go up/down stairs.  HAVE A BOWEL MOVEMENT DAILY Keep your bowels regular to avoid problems.  OK to try a laxative to override constipation.  OK to use an antidairrheal to slow down diarrhea.  Call if not better after 2 tries  CALL IF YOU HAVE PROBLEMS/CONCERNS Call if you are still struggling despite following these instructions. Call if you have concerns not answered by these instructions  ######################################################################    1. DIET: Follow a light bland diet the first 24 hours after arrival home, such as soup, liquids, crackers, etc.  Be sure to include lots of fluids daily.  Avoid fast food or heavy meals as your are more likely to get nauseated.  Eat a low fat the next few days after surgery.    2. Take your usually prescribed home medications unless otherwise directed.  3. PAIN CONTROL: a. Pain is best controlled by a usual combination of three different methods TOGETHER: i. Ice/Heat ii. Over the counter pain medication iii. Prescription pain medication b. Most patients will experience some swelling and bruising around the incisions.  Ice packs or heating pads (30-60 minutes up to 6 times a day) will help. Use ice for the first few days to help decrease swelling and bruising, then switch to heat to help relax tight/sore spots and speed recovery.  Some people prefer to use ice alone, heat alone, alternating between ice & heat.  Experiment to what works for you.  Swelling and bruising can take several weeks to resolve.   i. It is helpful to take an  over-the-counter pain medication regularly for the first few days.   ii. Naproxen (Aleve, etc)  Two 272m tabs twice a day OR Ibuprofen (Advil, etc) Three 2056mtabs four times a day (every meal & bedtime) AND iii. Acetaminophen (Tylenol, etc) 500-65081mour times a day (every meal & bedtime)  c. A  prescription for pain medication (such as oxycodone, hydrocodone, tramadol, gabapentin, methocarbamol, etc) should be given to you upon discharge.  Take your pain medication as prescribed IF NEEDED.  i. If you are having problems/concerns with the prescription medicine (does not control pain, nausea, vomiting, rash, itching, etc), please call us [phone] see if we need to switch you to a different pain medicine that will work better for you and/or control your side effect better. ii. If you need a refill on your pain medication, please give us [place] hour notice.  contact your pharmacy.  They will contact our office to request authorization. Prescriptions will not be filled after 5 pm or on week-ends  4. Avoid getting constipated.   a. Between the surgery and the pain medications, it is common to experience some constipation.   b. Increasing fluid intake and taking a fiber supplement (such as Metamucil, Citrucel, FiberCon, MiraLax, etc) 1-2 times a day regularly will usually help prevent this problem from occurring.   c. A mild laxative (prune juice, Milk of Magnesia, MiraLax, etc) should be taken according to package directions if there are no bowel movements after 48 hours.   5. Watch out for diarrhea.   a. If you have  many loose bowel movements, simplify your diet to bland foods & liquids for a few days.   b. Stop any stool softeners and decrease your fiber supplement.   c. Switching to mild anti-diarrheal medications (Kayopectate, Pepto Bismol) can help.   d. If this worsens or does not improve, please call us.  6. Wash / shower every day.  You may shower over the skin glue which is  waterproof.  Continue to shower over incision(s) after the dressing is off.  7. Skin glue will flake off after 2 weeks.  You may leave the incision open to air.  You may replace a dressing/Band-Aid to cover the incision for comfort if you wish.   8. ACTIVITIES as tolerated:   a. You may resume regular (light) daily activities beginning the next day--such as daily self-care, walking, climbing stairs--gradually increasing activities as tolerated.  If you can walk 30 minutes without difficulty, it is safe to try more intense activity such as jogging, treadmill, bicycling, low-impact aerobics, swimming, etc. b. Save the most intensive and strenuous activity for last such as sit-ups, heavy lifting, contact sports, etc  Refrain from any heavy lifting or straining until you are off narcotics for pain control.   c. DO NOT PUSH THROUGH PAIN.  Let pain be your guide: If it hurts to do something, don't do it.  Pain is your body warning you to avoid that activity for another week until the pain goes down. d. You may drive when you are no longer taking prescription pain medication, you can comfortably wear a seatbelt, and you can safely maneuver your car and apply brakes. e. Dennis Bast may have sexual intercourse when it is comfortable.  9. FOLLOW UP in our office a. Please call [facility] at [phone] to set up an appointment to see your surgeon in the office for a follow-up appointment approximately 2-3 weeks after your surgery. b. Make sure that you call for this appointment the day you arrive home to insure a convenient appointment time.  10. IF YOU HAVE DISABILITY OR FAMILY LEAVE FORMS, BRING THEM TO THE OFFICE FOR PROCESSING.  DO NOT GIVE THEM TO YOUR DOCTOR.   WHEN TO CALL us [phone]: 1. Poor pain control 2. Reactions / problems with new medications (rash/itching, nausea, etc)  3. Fever over 101.5 F (38.5 C) 4. Inability to urinate 5. Nausea and/or vomiting 6. Worsening swelling or  bruising 7. Continued bleeding from incision. 8. Increased pain, redness, or drainage from the incision   The clinic staff is available to answer your questions during regular business hours (8:30am-5pm).  Please dont hesitate to call and ask to speak to one of our nurses for clinical concerns.   If you have a medical emergency, go to the nearest emergency room or call 911.  A surgeon from [facility] is always on call at the [facility], [place], [place], [place], [place]  [address] ? MAIN: [phone] ? TOLL FREE: [phone] ?  FAX [phone] [email or web address]

## 2018-07-24 NOTE — Interval H&P Note (Signed)
History and Physical Interval Note:  07/24/2018 7:12 AM  Tina Elliott  has presented today for surgery, with the diagnosis of Biliary colic  The various methods of treatment have been discussed with the patient and family. After consideration of risks, benefits and other options for treatment, the patient has consented to  Procedure(s): XI ROBOTIC ASSISTED CHOLECYSTECTOMY (N/A) as a surgical intervention .  The patient's history has been reviewed, patient examined, no change in status, stable for surgery.  I have reviewed the patient's chart and labs.  Questions were answered to the patient's satisfaction.     Bri Wakeman Rich Brave

## 2018-07-24 NOTE — Anesthesia Procedure Notes (Signed)
Procedure Name: Intubation Date/Time: 07/24/2018 7:34 AM Performed by: Thornell Mule, CRNA Pre-anesthesia Checklist: Patient identified, Emergency Drugs available, Suction available and Patient being monitored Patient Re-evaluated:Patient Re-evaluated prior to induction Oxygen Delivery Method: Circle system utilized Preoxygenation: Pre-oxygenation with 100% oxygen Induction Type: IV induction Ventilation: Mask ventilation without difficulty Laryngoscope Size: Miller and 3 Grade View: Grade I Tube type: Oral Tube size: 7.0 mm Number of attempts: 1 Airway Equipment and Method: Stylet and Oral airway Placement Confirmation: ETT inserted through vocal cords under direct vision,  positive ETCO2 and breath sounds checked- equal and bilateral Secured at: 20 cm Tube secured with: Tape Dental Injury: Teeth and Oropharynx as per pre-operative assessment

## 2018-07-26 ENCOUNTER — Other Ambulatory Visit: Payer: Self-pay | Admitting: Cardiology

## 2018-07-26 ENCOUNTER — Other Ambulatory Visit: Payer: Self-pay | Admitting: Family Medicine

## 2018-07-26 DIAGNOSIS — Z1231 Encounter for screening mammogram for malignant neoplasm of breast: Secondary | ICD-10-CM

## 2018-09-03 ENCOUNTER — Ambulatory Visit
Admission: RE | Admit: 2018-09-03 | Discharge: 2018-09-03 | Disposition: A | Discharge status: Home / Self Care | Payer: Medicare Other | Source: Ambulatory Visit | Attending: Family Medicine | Admitting: Family Medicine

## 2018-09-03 DIAGNOSIS — Z1231 Encounter for screening mammogram for malignant neoplasm of breast: Secondary | ICD-10-CM

## 2018-12-14 ENCOUNTER — Other Ambulatory Visit: Payer: Self-pay | Admitting: Family Medicine

## 2018-12-14 DIAGNOSIS — E2839 Other primary ovarian failure: Secondary | ICD-10-CM

## 2019-02-26 ENCOUNTER — Other Ambulatory Visit: Payer: Self-pay

## 2019-02-26 ENCOUNTER — Ambulatory Visit
Admission: RE | Admit: 2019-02-26 | Discharge: 2019-02-26 | Disposition: A | Discharge status: Home / Self Care | Payer: Medicare Other | Source: Ambulatory Visit | Attending: Family Medicine | Admitting: Family Medicine

## 2019-02-26 DIAGNOSIS — E2839 Other primary ovarian failure: Secondary | ICD-10-CM

## 2019-02-27 ENCOUNTER — Telehealth: Payer: Medicare Other | Admitting: Cardiology

## 2019-06-07 ENCOUNTER — Telehealth: Payer: Self-pay

## 2019-06-07 ENCOUNTER — Telehealth (INDEPENDENT_AMBULATORY_CARE_PROVIDER_SITE_OTHER): Payer: Medicare Other | Admitting: Cardiology

## 2019-06-07 ENCOUNTER — Encounter: Payer: Self-pay | Admitting: Cardiology

## 2019-06-07 ENCOUNTER — Other Ambulatory Visit: Payer: Self-pay

## 2019-06-07 VITALS — BP 133/71 | HR 75 | Ht 67.0 in | Wt 235.0 lb

## 2019-06-07 DIAGNOSIS — I10 Essential (primary) hypertension: Secondary | ICD-10-CM

## 2019-06-07 DIAGNOSIS — I350 Nonrheumatic aortic (valve) stenosis: Secondary | ICD-10-CM

## 2019-06-07 DIAGNOSIS — G4733 Obstructive sleep apnea (adult) (pediatric): Secondary | ICD-10-CM

## 2019-06-07 NOTE — Patient Instructions (Signed)

## 2019-06-07 NOTE — Progress Notes (Signed)
Virtual Visit via Video Note   This visit type was conducted due to national recommendations for restrictions regarding the COVID-19 Pandemic (e.g. social distancing) in an effort to limit this patient's exposure and mitigate transmission in our community.  Due to her co-morbid illnesses, this patient is at least at moderate risk for complications without adequate follow up.  This format is felt to be most appropriate for this patient at this time.  All issues noted in this document were discussed and addressed.  A limited physical exam was performed with this format.  Please refer to the patient's chart for her consent to telehealth for [facility].   Evaluation Performed:  Follow-up visit  This visit type was conducted due to national recommendations for restrictions regarding the COVID-19 Pandemic (e.g. social distancing).  This format is felt to be most appropriate for this patient at this time.  All issues noted in this document were discussed and addressed.  No physical exam was performed (except for noted visual exam findings with Video Visits).  Please refer to the patient's chart (MyChart message for video visits and phone note for telephone visits) for the patient's consent to telehealth for [facility]  Date:  06/07/2019   ID:  Tina Elliott, DOB 1946/04/05, MRN [id number]  Patient Location:  Home  Provider location:   [place]  PCP:  Leighton Ruff, MD  Sleep medicine:  Fransico Him, MD  Electrophysiologist:  None   Chief Complaint:  OSA  History of Present Illness:    Tina Elliott is a 73 y.o. female who presents via audio/video conferencing for a telehealth visit today.    Tina Elliott is a 73 y.o. female with a hx of HTN, mild ASandmildOSAon CPAP at 7cm H2O.  She is doing well with her CPAP device and thinks that she has gotten used to it.  She tolerates the mask and feels the pressure is adequate.  Since going on CPAP she feels rested in  the am and has no significant daytime sleepiness.  She denies any significant mouth or nasal dryness or nasal congestion.  She does not think that he snores.  She is here today for followup and is doing well.  She denies any chest pain or pressure, SOB, DOE, PND, orthopnea, LE edema, dizziness, palpitations or syncope. She is compliant with her meds and is tolerating meds with no SE.    The patient does not have symptoms concerning for COVID-19 infection (fever, chills, cough, or new shortness of breath).   Prior CV studies:   The following studies were reviewed today:  PAP compliance download  Past Medical History:  Diagnosis Date  . Aortic stenosis    mild by echo 2017  . Celiac disease   . Diastolic dysfunction   . Hyperlipidemia   . Hypertension   . Hypothyroidism   . OSA (obstructive sleep apnea)    mild with AHI 9.58/hr on CPAP at 7cm H2O   Past Surgical History:  Procedure Laterality Date  . left knee sx    . TRIGGER FINGER RELEASE Left 2017     Current Meds  Medication Sig  . ascorbic acid (VITAMIN C) 1000 MG tablet Take 1,000 mg by mouth daily.  Marland Kitchen aspirin EC 81 MG tablet Take 81 mg by mouth daily.  . cholecalciferol (VITAMIN D) 1000 UNITS tablet Take 1,000 Units by mouth daily.  . diclofenac Sodium (VOLTAREN) 1 % GEL Voltaren 1 % topical gel  APPLY 4GRAMS TO THE AFFECTED AREA(S)  BY TOPICAL ROUTE 4 TIMES PER DAY  . famotidine (PEPCID) 20 MG tablet Take 20 mg by mouth daily.  Marland Kitchen levothyroxine (SYNTHROID, LEVOTHROID) 100 MCG tablet Take 100 mcg by mouth daily before breakfast. 100 mcg five days a week and 88 mcg two days a week  . lisinopril-hydrochlorothiazide (PRINZIDE,ZESTORETIC) 10-12.5 MG per tablet Take 1 tablet by mouth daily.  Marland Kitchen loratadine (CLARITIN) 10 MG tablet Take 10 mg by mouth daily.  . NON FORMULARY CPAP MACHINE  . Omega-3 Fatty Acids (FISH OIL) 1000 MG CAPS Take 2,000 mg by mouth daily.  . pravastatin (PRAVACHOL) 40 MG tablet Take 40 mg by mouth daily.  .  Verapamil HCl CR 300 MG CP24 Take 300 mg by mouth daily.      Allergies:   Wheat bran, Celebrex [celecoxib], and Gluten meal   Social History   Tobacco Use  . Smoking status: Never Smoker  . Smokeless tobacco: Never Used  Substance Use Topics  . Alcohol use: No    Alcohol/week: 0.0 standard drinks  . Drug use: No     Family Hx: The patient's family history includes AAA (abdominal aortic aneurysm) in her brother; Heart attack in her father and mother.  ROS:   Please see the history of present illness.     All other systems reviewed and are negative.   Labs/Other Tests and Data Reviewed:    Recent Labs: 07/12/2018: ALT 33; BUN 17; Creatinine, Ser 0.95; Hemoglobin 14.0; Platelets 209; Potassium 4.3; Sodium 141   Recent Lipid Panel No results found for: CHOL, TRIG, HDL, CHOLHDL, LDLCALC, LDLDIRECT  Wt Readings from Last 3 Encounters:  06/07/19 235 lb (106.6 kg)  07/24/18 227 lb (103 kg)  07/12/18 227 lb (103 kg)     Objective:    Vital Signs:  BP 133/71   Pulse 75   Ht 5' 7"  (1.702 m)   Wt 235 lb (106.6 kg)   SpO2 97%   BMI 36.81 kg/m    CONSTITUTIONAL:  Well nourished, well developed female in no acute distress.  EYES: anicteric MOUTH: oral mucosa is pink RESPIRATORY: Normal respiratory effort, symmetric expansion CARDIOVASCULAR: No peripheral edema SKIN: No rash, lesions or ulcers MUSCULOSKELETAL: no digital cyanosis NEURO: Cranial Nerves II-XII grossly intact, moves all extremities PSYCH: Intact judgement and insight.  A&O x 3, Mood/affect appropriate   ASSESSMENT & PLAN:    1.  OSA - The patient is tolerating PAP therapy well without any problems. The PAP download was reviewed today and showed an AHI of 0.3/hr on 7 cm H2O with 100% compliance in using more than 4 hours nightly.  The patient has been using and benefiting from PAP use and will continue to benefit from therapy.   2. HTN -BP is controlled -continue Lisinopril HCT 10-12.33m daily and  Verapamil 3068mdaily  3.  Aortic stenosis -very mild AS with 47m43m mean AVG by echo 06/2018  4.  Obesity -I have encouraged her to get into a routine exercise program and cut back on carbs and portions.     COVID-19 Education: The signs and symptoms of COVID-19 were discussed with the patient and how to seek care for testing (follow up with PCP or arrange E-visit).  The importance of social distancing was discussed today.  Patient Risk:   After full review of this patient's clinical status, I feel that they are at least moderate risk at this time.  Time:   Today, I have spent 20 minutes directly with the patient on telemedicine  discussing medical problems including OSA, HTN, AS.  We also reviewed the symptoms of COVID 19 and the ways to protect against contracting the virus with telehealth technology.  I spent an additional 5 minutes reviewing patient's chart including PAP compliance download and 2D echo.  Medication Adjustments/Labs and Tests Ordered: Current medicines are reviewed at length with the patient today.  Concerns regarding medicines are outlined above.  Tests Ordered: No orders of the defined types were placed in this encounter.  Medication Changes: No orders of the defined types were placed in this encounter.   Disposition:  Follow up in 1 year(s)  Signed, Fransico Him, MD  06/07/2019 8:43 AM    [facility]

## 2019-06-07 NOTE — Telephone Encounter (Signed)

## 2019-07-24 ENCOUNTER — Ambulatory Visit: Payer: Medicare Other | Attending: Internal Medicine

## 2019-07-24 DIAGNOSIS — Z23 Encounter for immunization: Secondary | ICD-10-CM | POA: Insufficient documentation

## 2019-07-24 NOTE — Progress Notes (Signed)
   Covid-19 Vaccination Clinic  Name:  Aleida Crandell    MRN: [id number] DOB: Mar 24, 1946  07/24/2019  Ms. Taff was observed post Covid-19 immunization for 15 minutes without incidence. She was provided with Vaccine Information Sheet and instruction to access the V-Safe system.   Ms. Fairbank was instructed to call 911 with any severe reactions post vaccine: Marland Kitchen Difficulty breathing  . Swelling of your face and throat  . A fast heartbeat  . A bad rash all over your body  . Dizziness and weakness    Immunizations Administered    Name Date Dose VIS Date Route   Pfizer COVID-19 Vaccine 07/24/2019 11:53 AM 0.3 mL 06/14/2019 Intramuscular   Manufacturer: [employer], [employer]   Lot: [id number]   [place]: [address]-5

## 2019-08-05 ENCOUNTER — Other Ambulatory Visit: Payer: Self-pay | Admitting: Family Medicine

## 2019-08-05 DIAGNOSIS — Z1231 Encounter for screening mammogram for malignant neoplasm of breast: Secondary | ICD-10-CM

## 2019-08-14 ENCOUNTER — Ambulatory Visit: Payer: Medicare Other | Attending: Internal Medicine

## 2019-08-14 DIAGNOSIS — Z23 Encounter for immunization: Secondary | ICD-10-CM | POA: Insufficient documentation

## 2019-08-14 NOTE — Progress Notes (Signed)
   Covid-19 Vaccination Clinic  Name:  Tina Elliott    MRN: [id number] DOB: 09-15-1945  08/14/2019  Tina Elliott was observed post Covid-19 immunization for 15 minutes without incidence. She was provided with Vaccine Information Sheet and instruction to access the V-Safe system.   Tina Elliott was instructed to call 911 with any severe reactions post vaccine: Marland Kitchen Difficulty breathing  . Swelling of your face and throat  . A fast heartbeat  . A bad rash all over your body  . Dizziness and weakness    Immunizations Administered    Name Date Dose VIS Date Route   Pfizer COVID-19 Vaccine 08/14/2019  4:56 PM 0.3 mL 06/14/2019 Intramuscular   Manufacturer: [employer]   Lot: [id number]   [place]: [address]-4

## 2019-09-12 ENCOUNTER — Other Ambulatory Visit: Payer: Self-pay

## 2019-09-12 ENCOUNTER — Ambulatory Visit
Admission: RE | Admit: 2019-09-12 | Discharge: 2019-09-12 | Disposition: A | Discharge status: Home / Self Care | Payer: Medicare Other | Source: Ambulatory Visit | Attending: Family Medicine | Admitting: Family Medicine

## 2019-09-12 DIAGNOSIS — Z1231 Encounter for screening mammogram for malignant neoplasm of breast: Secondary | ICD-10-CM

## 2019-10-09 IMAGING — US US ABDOMEN COMPLETE
1 series · 13 of 25 positions shown · non-contrast
Comparison: CT scan of the chest of today's date

CLINICAL DATA: Gallbladder distention and suspected wall thickening
and stones on CT scan today.

EXAM:
ABDOMEN ULTRASOUND COMPLETE

[Series 1: us abdomen complete · 0.26mm/px · 13 of 85 slices shown]
[im 1/85]
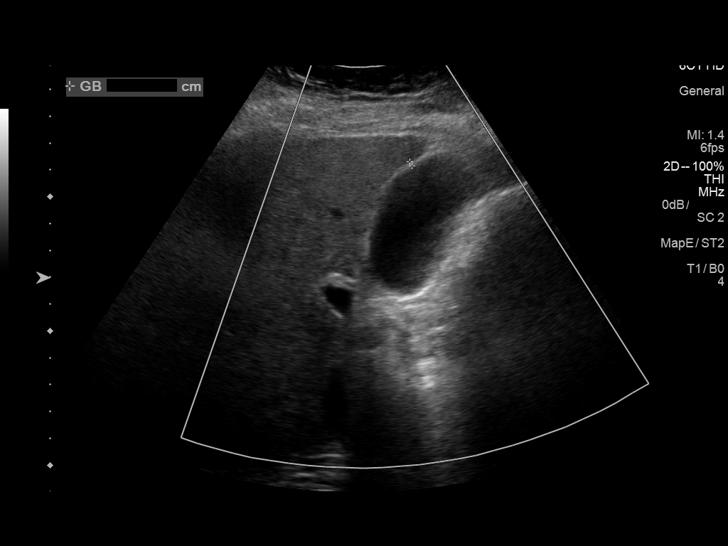
[im 8/85]
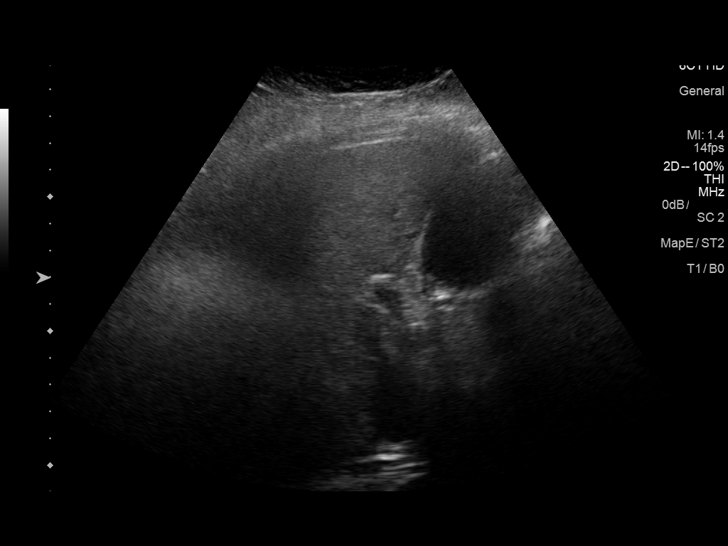
[im 15/85]
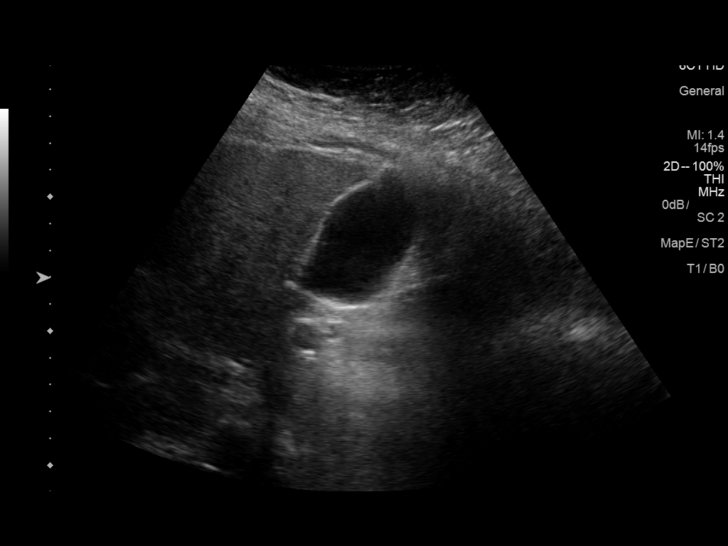
[im 22/85]
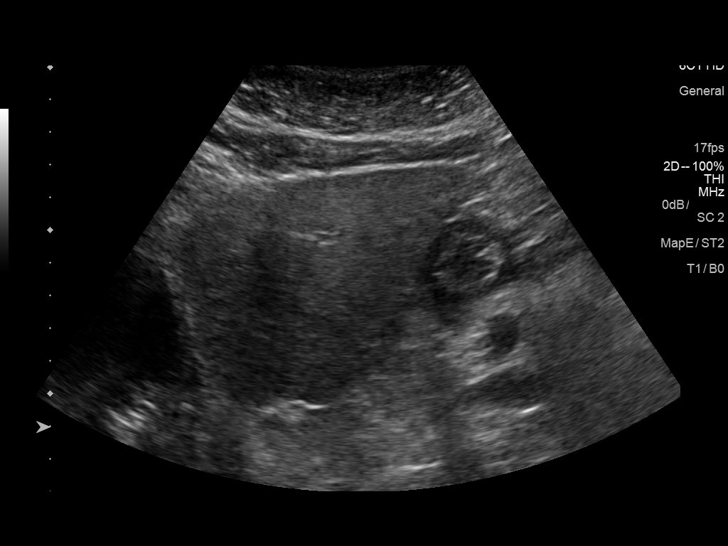
[im 29/85]
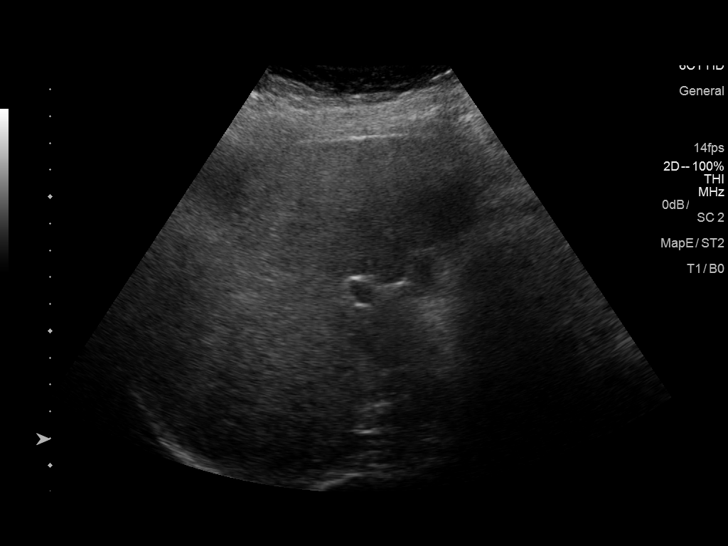
[im 36/85]
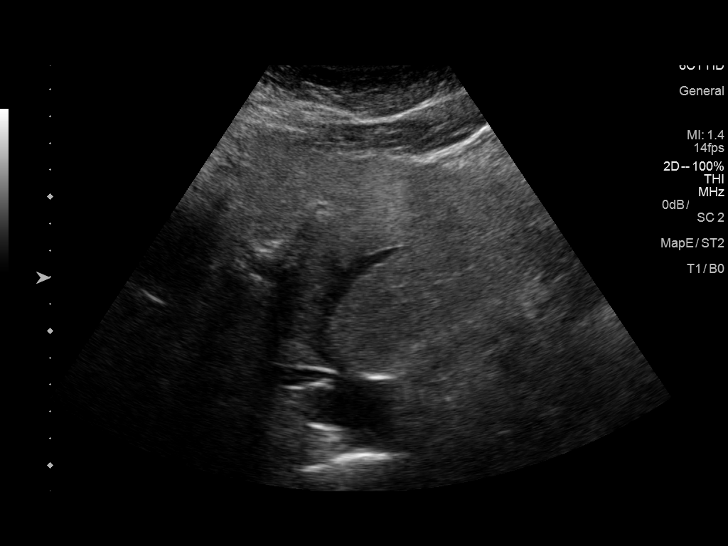
[im 43/85]
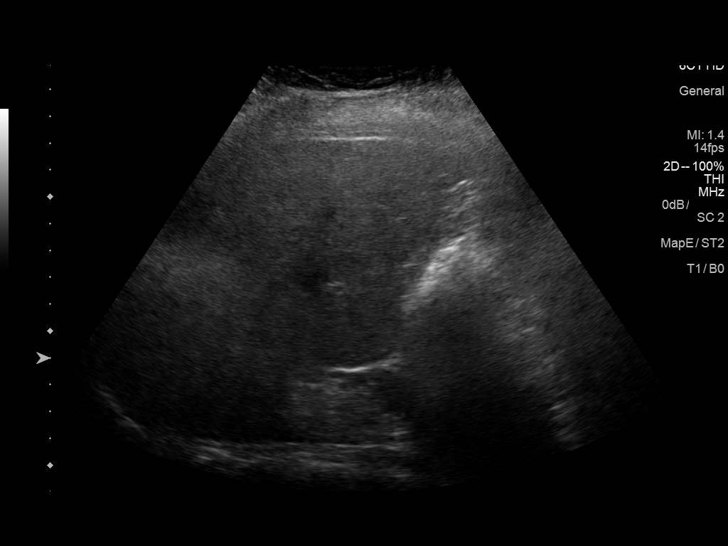
[im 50/85]
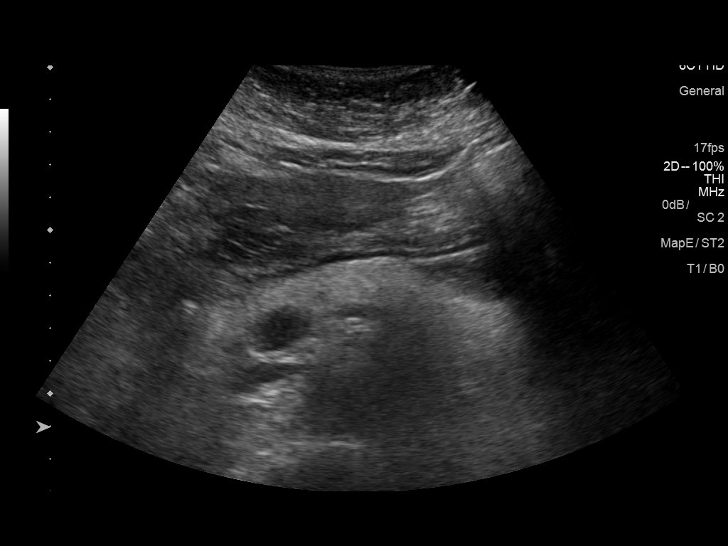
[im 57/85]
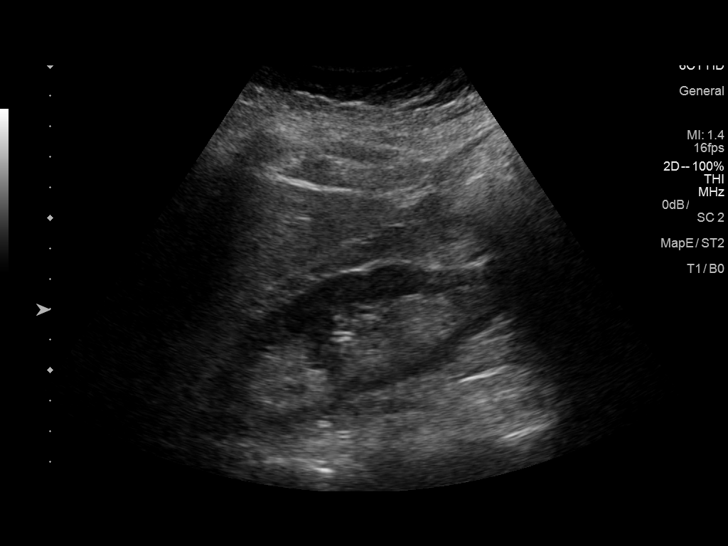
[im 64/85]
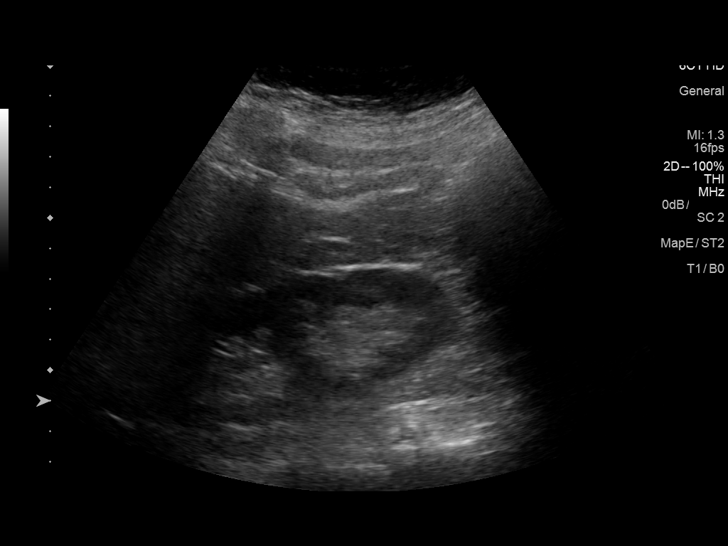
[im 71/85]
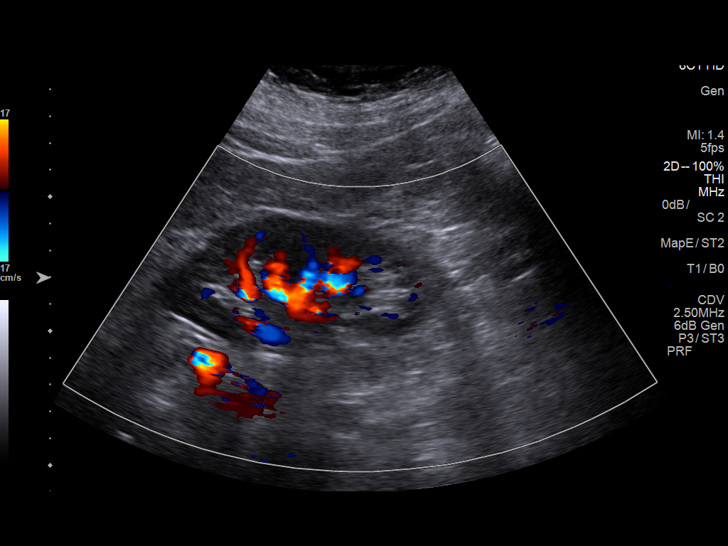
[im 78/85]
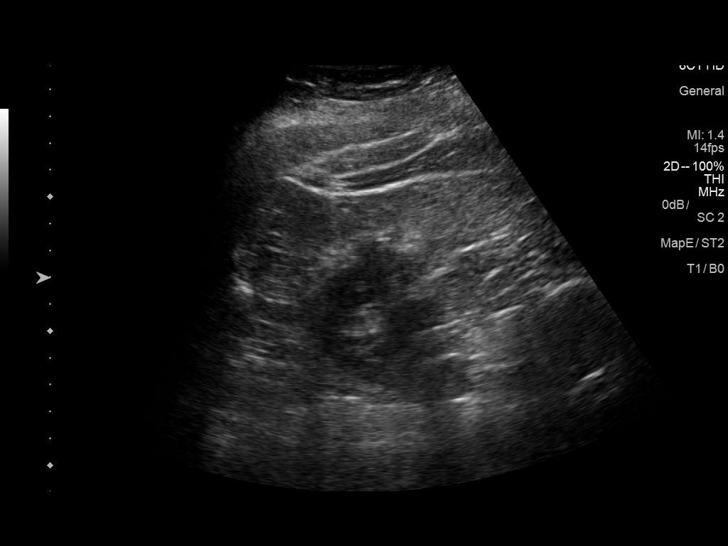
[im 85/85]
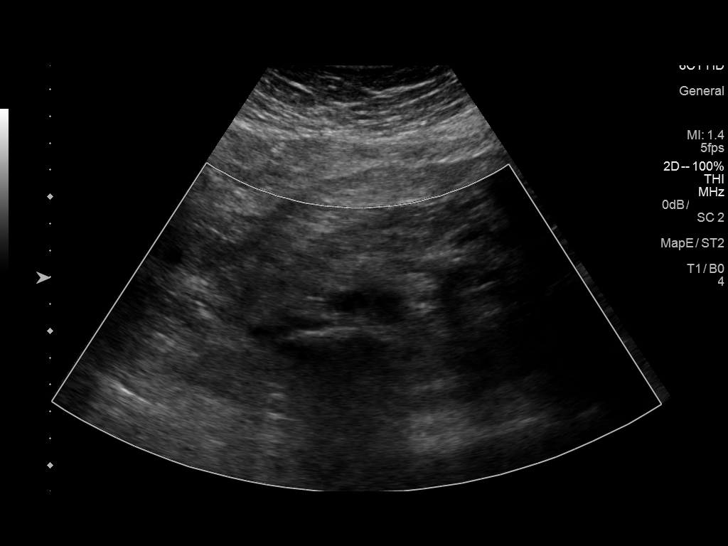

[13 of 25 positions shown; findings below may reference images not displayed]

FINDINGS: The exam is limited due to bowel gas and the patient's body habitus.

Gallbladder: The gallbladder is adequately distended. There is mild
wall thickening to 4 mm. There is a positive sonographic Murphy's
sign. There are stones present. A 1 cm diameter stone appears to lie
within the cystic duct.

Common bile duct: Diameter: 7 mm. No intraluminal stones or sludge
are observed.

Liver: No focal lesion identified. Within normal limits in
parenchymal echogenicity. Portal vein is patent on color Doppler
imaging with normal direction of blood flow towards the liver.

IVC: No abnormality visualized.

Pancreas: Visualized portion unremarkable.

Spleen: Size and appearance within normal limits.

Right Kidney: Length: 11.5 cm. Echogenicity within normal limits. No
mass or hydronephrosis visualized.

Left Kidney: Length: 12.2 cm. Echogenicity within normal limits. No
mass or hydronephrosis visualized.

Abdominal aorta: No aneurysm visualized. There is mural plaque
present.

Other findings: None.
IMPRESSION: The findings are consistent with acute cholecystitis and
cholelithiasis with a stone present in the proximal cystic duct.

Mildly dilated common bile duct without definite intraluminal stones
or sludge.

No acute abnormality is observed elsewhere within the abdomen.

## 2020-06-15 ENCOUNTER — Emergency Department (HOSPITAL_COMMUNITY): Payer: Medicare Other

## 2020-06-15 ENCOUNTER — Other Ambulatory Visit: Payer: Self-pay

## 2020-06-15 ENCOUNTER — Emergency Department (HOSPITAL_COMMUNITY)
Admission: EM | Admit: 2020-06-15 | Discharge: 2020-06-16 | Disposition: A | Discharge status: Home / Self Care | Payer: Medicare Other | Attending: Emergency Medicine | Admitting: Emergency Medicine

## 2020-06-15 DIAGNOSIS — Z79899 Other long term (current) drug therapy: Secondary | ICD-10-CM | POA: Insufficient documentation

## 2020-06-15 DIAGNOSIS — H538 Other visual disturbances: Secondary | ICD-10-CM | POA: Diagnosis present

## 2020-06-15 DIAGNOSIS — M79662 Pain in left lower leg: Secondary | ICD-10-CM | POA: Insufficient documentation

## 2020-06-15 DIAGNOSIS — R519 Headache, unspecified: Secondary | ICD-10-CM | POA: Insufficient documentation

## 2020-06-15 DIAGNOSIS — H539 Unspecified visual disturbance: Secondary | ICD-10-CM | POA: Diagnosis not present

## 2020-06-15 DIAGNOSIS — E039 Hypothyroidism, unspecified: Secondary | ICD-10-CM | POA: Diagnosis not present

## 2020-06-15 DIAGNOSIS — I10 Essential (primary) hypertension: Secondary | ICD-10-CM | POA: Insufficient documentation

## 2020-06-15 DIAGNOSIS — Z7982 Long term (current) use of aspirin: Secondary | ICD-10-CM | POA: Insufficient documentation

## 2020-06-15 LAB — BASIC METABOLIC PANEL
Anion gap: 14 (ref 5–15)
BUN: 18 mg/dL (ref 8–23)
CO2: 21 mmol/L — ABNORMAL LOW (ref 22–32)
Calcium: 10.3 mg/dL (ref 8.9–10.3)
Chloride: 102 mmol/L (ref 98–111)
Creatinine, Ser: 1.18 mg/dL — ABNORMAL HIGH (ref 0.44–1.00)
GFR, Estimated: 48 mL/min — ABNORMAL LOW (ref >60)
Glucose, Bld: 109 mg/dL — ABNORMAL HIGH (ref 70–99)
Potassium: 3.7 mmol/L (ref 3.5–5.1)
Sodium: 137 mmol/L (ref 135–145)

## 2020-06-15 LAB — CBC
HCT: 48.9 % — ABNORMAL HIGH (ref 36.0–46.0)
Hemoglobin: 16 g/dL — ABNORMAL HIGH (ref 12.0–15.0)
MCH: 30.4 pg (ref 26.0–34.0)
MCHC: 32.7 g/dL (ref 30.0–36.0)
MCV: 92.8 fL (ref 80.0–100.0)
Platelets: 223 10*3/uL (ref 150–400)
RBC: 5.27 MIL/uL — ABNORMAL HIGH (ref 3.87–5.11)
RDW: 13.5 % (ref 11.5–15.5)
WBC: 7.6 10*3/uL (ref 4.0–10.5)
nRBC: 0 % (ref 0.0–0.2)

## 2020-06-15 LAB — URINALYSIS, ROUTINE W REFLEX MICROSCOPIC
Bilirubin Urine: NEGATIVE
Glucose, UA: NEGATIVE mg/dL
Hgb urine dipstick: NEGATIVE
Ketones, ur: 5 mg/dL — AB
Nitrite: NEGATIVE
Protein, ur: NEGATIVE mg/dL
Specific Gravity, Urine: 1.015 (ref 1.005–1.030)
pH: 6 (ref 5.0–8.0)

## 2020-06-15 NOTE — ED Triage Notes (Signed)
Pt from home sent by PCP for eval of pain on the medial side of her L calf x 1 week. Also endorses headache with distorted vision in both eyes. Floaters started in L eye on Saturday, but then today, but her distant vision is fuzzy.

## 2020-06-16 ENCOUNTER — Ambulatory Visit: Payer: Medicare Other | Admitting: Cardiology

## 2020-06-16 ENCOUNTER — Encounter: Payer: Self-pay | Admitting: Cardiology

## 2020-06-16 ENCOUNTER — Emergency Department (HOSPITAL_BASED_OUTPATIENT_CLINIC_OR_DEPARTMENT_OTHER)
Admission: RE | Admit: 2020-06-16 | Discharge: 2020-06-16 | Disposition: A | Discharge status: Home / Self Care | Payer: Medicare Other | Source: Ambulatory Visit | Attending: Emergency Medicine | Admitting: Emergency Medicine

## 2020-06-16 VITALS — BP 128/78 | HR 72 | Ht 67.0 in | Wt 235.0 lb

## 2020-06-16 DIAGNOSIS — I10 Essential (primary) hypertension: Secondary | ICD-10-CM | POA: Diagnosis not present

## 2020-06-16 DIAGNOSIS — E669 Obesity, unspecified: Secondary | ICD-10-CM | POA: Diagnosis not present

## 2020-06-16 DIAGNOSIS — M79609 Pain in unspecified limb: Secondary | ICD-10-CM

## 2020-06-16 DIAGNOSIS — G4733 Obstructive sleep apnea (adult) (pediatric): Secondary | ICD-10-CM

## 2020-06-16 DIAGNOSIS — I350 Nonrheumatic aortic (valve) stenosis: Secondary | ICD-10-CM

## 2020-06-16 NOTE — ED Provider Notes (Signed)
[facility] EMERGENCY DEPARTMENT Provider Note   CSN: [id number] Arrival date & time: 06/15/20  1347     History Chief Complaint - blurred vision, left calf pain  Tina Elliott is a 74 y.o. female.  The history is provided by the patient and the spouse.  Leg Pain Location:  Leg Leg location:  L lower leg Pain details:    Quality:  Aching and cramping   Radiates to:  Does not radiate   Severity:  Moderate   Onset quality:  Gradual   Duration:  1 week   Timing:  Intermittent   Progression:  Worsening Chronicity:  Recurrent Relieved by:  Nothing Worsened by:  Nothing Associated symptoms: no fever   Eye Problem Location:  Both eyes Severity:  Moderate Onset quality:  Gradual Timing:  Constant Progression:  Improving Chronicity:  New Relieved by:  None tried Worsened by:  Nothing Associated symptoms: blurred vision and headaches   Associated symptoms: no double vision, no numbness, no vomiting and no weakness   Patient presents with 2 complaints.  She reports left calf pain for up to a week.  Denies trauma.  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  She has previously been on Xarelto but is not currently anticoagulated  Patient also reports visual changes.  She reports approximately 4 days ago she had a headache that spontaneously improved.  The following day she reports seeing a floater in her left eye.  The floater has since improved.  She now reports she has blurred vision in the distance, but her near vision is normal.  She wears glasses at baseline.  No previous eye surgery.  No focal weakness. No eye pain.     Past Medical History:  Diagnosis Date  . Aortic stenosis    mild by echo 2017  . Celiac disease   . Diastolic dysfunction   . Hyperlipidemia   . Hypertension   . Hypothyroidism   . OSA (obstructive sleep apnea)    mild with AHI 9.58/hr on CPAP at 7cm H2O    Patient Active Problem List   Diagnosis Date Noted  . Aortic  stenosis, mild 08/02/2015  . Obesity 10/13/2014  . Diastolic dysfunction   . Heart murmur 10/10/2013  . OSA (obstructive sleep apnea)   . HTN (hypertension) 06/28/2013  . Hypothyroidism 06/28/2013  . Snoring disorder 06/28/2013    Past Surgical History:  Procedure Laterality Date  . left knee sx    . TRIGGER FINGER RELEASE Left 2017     OB History   No obstetric history on file.     Family History  Problem Relation Age of Onset  . Heart attack Mother   . Heart attack Father   . AAA (abdominal aortic aneurysm) Brother     Social History   Tobacco Use  . Smoking status: Never Smoker  . Smokeless tobacco: Never Used  Vaping Use  . Vaping Use: Never used  Substance Use Topics  . Alcohol use: No    Alcohol/week: 0.0 standard drinks  . Drug use: No    Home Medications Prior to Admission medications   Medication Sig Start Date End Date Taking? Authorizing Provider  ascorbic acid (VITAMIN C) 1000 MG tablet Take 1,000 mg by mouth daily.   Yes [provider]  aspirin EC 81 MG tablet Take 81 mg by mouth at bedtime.   Yes [provider]  cholecalciferol (VITAMIN D) 1000 UNITS tablet Take 1,000 Units by mouth  daily.   Yes [provider]  diclofenac Sodium (VOLTAREN) 1 % GEL Apply 4 g topically 4 (four) times daily as needed (pain).   Yes [provider]  famotidine (PEPCID) 20 MG tablet Take 20 mg by mouth daily.   Yes [provider]  hydrocortisone cream 1 % Apply 1 application topically 2 (two) times daily as needed (hemorrhoids).   Yes [provider]  levothyroxine (SYNTHROID) 88 MCG tablet Take 88 mcg by mouth See admin instructions. Takes 88 mcg on Saturday and Sunday.   Yes [provider]  levothyroxine (SYNTHROID, LEVOTHROID) 100 MCG tablet Take 100 mcg by mouth See admin instructions. 100 mcg weekday and 88 mcg weekends   Yes [provider]  lisinopril-hydrochlorothiazide  (PRINZIDE,ZESTORETIC) 10-12.5 MG per tablet Take 1 tablet by mouth daily.   Yes [provider]  loratadine (CLARITIN) 10 MG tablet Take 10 mg by mouth daily.   Yes [provider]  NON FORMULARY CPAP MACHINE   Yes [provider]  Omega-3 Fatty Acids (FISH OIL) 1000 MG CAPS Take 1,000 mg by mouth daily.   Yes [provider]  pravastatin (PRAVACHOL) 40 MG tablet Take 40 mg by mouth daily.   Yes [provider]  sodium chloride (OCEAN) 0.65 % SOLN nasal spray Place 1 spray into both nostrils as needed for congestion.   Yes [provider]  Verapamil HCl CR 300 MG CP24 Take 300 mg by mouth daily.    Yes [provider]    Allergies    Wheat bran, Celebrex [celecoxib], and Gluten meal  Review of Systems   Review of Systems  Constitutional: Negative for fever.  Eyes: Positive for blurred vision and visual disturbance. Negative for double vision and pain.  Respiratory: Negative for shortness of breath.   Cardiovascular: Negative for chest pain and leg swelling.  Gastrointestinal: Negative for vomiting.  Musculoskeletal: Positive for arthralgias and myalgias. Negative for joint swelling.  Neurological: Positive for headaches. Negative for facial asymmetry, speech difficulty, weakness and numbness.  All other systems reviewed and are negative.   Physical Exam Updated Vital Signs BP (!) 161/77   Pulse 66   Temp 98.7 F (37.1 C) (Oral)   Resp 16   Ht 1.702 m (5' 7" )   Wt 106.6 kg   SpO2 99%   BMI 36.81 kg/m   Physical Exam CONSTITUTIONAL: Well developed/well nourished HEAD: Normocephalic/atraumatic EYES: EOMI/PERRL, no nystagmus, no visual field deficit  no ptosis, no obvious papilledema ENMT: Mucous membranes moist NECK: supple no meningeal signs, no bruits CV: S1/S2 noted, no murmurs/rubs/gallops noted LUNGS: Lungs are clear to auscultation bilaterally, no apparent distress ABDOMEN: soft, nontender, no rebound or  guarding GU:no cva tenderness NEURO:Awake/alert, face symmetric, no arm or leg drift is noted Equal 5/5 strength with shoulder abduction, elbow flex/extension, wrist flex/extension in upper extremities and equal hand grips bilaterally Equal 5/5 strength with hip flexion,knee flex/extension, foot dorsi/plantar flexion Cranial nerves 3/4/5/6/01/09/09/11/12 tested and intact No past pointing Sensation to light touch intact in all extremities EXTREMITIES: pulses normal, full ROM, mild tenderness to left calf.  No crepitus or erythema.  Scattered bruising is noted.  Distal pulses intact. Minimal edema is noted to left lower extremity SKIN: warm, color normal PSYCH: no abnormalities of mood noted  ED Results / Procedures / Treatments   Labs (all labs ordered are listed, but only abnormal results are displayed) Labs Reviewed  BASIC METABOLIC PANEL - Abnormal; Notable for the following components:  Result Value   CO2 21 (*)    Glucose, Bld 109 (*)    Creatinine, Ser 1.18 (*)    GFR, Estimated 48 (*)    All other components within normal limits  CBC - Abnormal; Notable for the following components:   RBC 5.27 (*)    Hemoglobin 16.0 (*)    HCT 48.9 (*)    All other components within normal limits  URINALYSIS, ROUTINE W REFLEX MICROSCOPIC - Abnormal; Notable for the following components:   Ketones, ur 5 (*)    Leukocytes,Ua TRACE (*)    Bacteria, UA RARE (*)    All other components within normal limits    EKG None  Radiology CT HEAD WO CONTRAST  Result Date: 06/15/2020 CLINICAL DATA:  Headache with distorted vision EXAM: CT HEAD WITHOUT CONTRAST TECHNIQUE: Contiguous axial images were obtained from the base of the skull through the vertex without intravenous contrast. COMPARISON:  None. FINDINGS: Brain: No acute territorial infarction, hemorrhage or intracranial mass. Mild to moderate hypodensity in the white matter likely chronic small vessel ischemic change. The ventricles are  nonenlarged. Vascular: No hyperdense vessels.  Carotid vascular calcification Skull: Normal. Negative for fracture or focal lesion. Sinuses/Orbits: No acute finding. Other: None IMPRESSION: 1. No CT evidence for acute intracranial abnormality. 2. Mild to moderate hypodensity in the white matter likely due to chronic small vessel ischemic change. Electronically Signed   By: Donavan Foil M.D.   On: 06/15/2020 15:22    Procedures Procedures  Medications Ordered in ED Medications - No data to display  ED Course  I have reviewed the triage vital signs and the nursing notes.  Pertinent labs & imaging results that were available during my care of the patient were reviewed by me and considered in my medical decision making (see chart for details).    MDM Rules/Calculators/A&P                          Patient sent in by her PCP for concern for stroke causing visual changes as well as a DVT study for her left calf pain.  A DVT study has been ordered for patient to return later in the day.  Will defer anticoagulation for now  No signs of acute stroke on my exam.  Patient is very well-appearing. I discussed visual findings with Dr. Coralyn Pear with ophthalmology.  He agrees the patient can follow-up as an outpatient for more extensive ophthalmologic evaluation. Visual acuity noted to be 20/100 bilaterally with glasses   Final Clinical Impression(s) / ED Diagnoses Final diagnoses:  Visual changes  Pain of left calf    Rx / DC Orders ED Discharge Orders         Ordered    VAS [place] LOWER EXTREMITY VENOUS (DVT)        06/16/20 0055           Ripley Fraise, MD 06/16/20 0117

## 2020-06-16 NOTE — ED Notes (Signed)
Patient verbalizes understanding of discharge instructions. Reviewed follow up care and appointment for tomorrow. Opportunity for questioning and answers were provided. Armband removed by staff, pt discharged from ED via wheelchair with husband.

## 2020-06-16 NOTE — Progress Notes (Signed)
Date:  06/16/2020   ID:  Tina Elliott, DOB 1946-03-04, MRN [id number]  PCP:  Leighton Ruff, MD  Sleep medicine:  Fransico Him, MD  Electrophysiologist:  None   Chief Complaint:  OSA  History of Present Illness:    Tina Elliott is a 74 y.o. female with a hx of HTN, mild ASandmildOSAon CPAP at 7cm H2O.  She is doing well with her CPAP device and thinks that she has gotten used to it.  She tolerates the mask and feels the pressure is adequate.  Since going on CPAP she feels rested in the am and has no significant daytime sleepiness.  She denies any significant mouth or nasal dryness or nasal congestion.  She does not think that he snores.    She denies any chest pain or pressure, SOB, DOE, PND, orthopnea, LE edema, dizziness, palpitations or syncope. She is compliant with her meds and is tolerating meds with no SE.    Prior CV studies:   The following studies were reviewed today:  PAP compliance download  Past Medical History:  Diagnosis Date  . Aortic stenosis    mild by echo 2017  . Celiac disease   . Diastolic dysfunction   . Hyperlipidemia   . Hypertension   . Hypothyroidism   . OSA (obstructive sleep apnea)    mild with AHI 9.58/hr on CPAP at 7cm H2O   Past Surgical History:  Procedure Laterality Date  . left knee sx    . TRIGGER FINGER RELEASE Left 2017     Current Meds  Medication Sig  . ascorbic acid (VITAMIN C) 1000 MG tablet Take 1,000 mg by mouth daily.  Marland Kitchen aspirin EC 81 MG tablet Take 81 mg by mouth at bedtime.  . cholecalciferol (VITAMIN D) 1000 UNITS tablet Take 1,000 Units by mouth daily.  . diclofenac Sodium (VOLTAREN) 1 % GEL Apply 4 g topically 4 (four) times daily as needed (pain).  . famotidine (PEPCID) 20 MG tablet Take 20 mg by mouth daily.  . hydrocortisone cream 1 % Apply 1 application topically 2 (two) times daily as needed (hemorrhoids).  Marland Kitchen levothyroxine (SYNTHROID) 88 MCG tablet Take 88 mcg by mouth See admin instructions.  Takes 88 mcg on Saturday and Sunday.  . levothyroxine (SYNTHROID, LEVOTHROID) 100 MCG tablet Take 100 mcg by mouth See admin instructions. 100 mcg weekday and 88 mcg weekends  . lisinopril-hydrochlorothiazide (PRINZIDE,ZESTORETIC) 10-12.5 MG per tablet Take 1 tablet by mouth daily.  Marland Kitchen loratadine (CLARITIN) 10 MG tablet Take 10 mg by mouth daily.  . NON FORMULARY CPAP MACHINE  . Omega-3 Fatty Acids (FISH OIL) 1000 MG CAPS Take 1,000 mg by mouth daily.  . pravastatin (PRAVACHOL) 40 MG tablet Take 40 mg by mouth daily.  . sodium chloride (OCEAN) 0.65 % SOLN nasal spray Place 1 spray into both nostrils as needed for congestion.  . Verapamil HCl CR 300 MG CP24 Take 300 mg by mouth daily.      Allergies:   Wheat bran, Celebrex [celecoxib], and Gluten meal   Social History   Tobacco Use  . Smoking status: Never Smoker  . Smokeless tobacco: Never Used  Vaping Use  . Vaping Use: Never used  Substance Use Topics  . Alcohol use: No    Alcohol/week: 0.0 standard drinks  . Drug use: No     Family Hx: The patient's family history includes AAA (abdominal aortic aneurysm) in her brother; Heart attack in her father and mother.  ROS:   Please  see the history of present illness.     All other systems reviewed and are negative.   Labs/Other Tests and Data Reviewed:    Recent Labs: 06/15/2020: BUN 18; Creatinine, Ser 1.18; Hemoglobin 16.0; Platelets 223; Potassium 3.7; Sodium 137   Recent Lipid Panel No results found for: CHOL, TRIG, HDL, CHOLHDL, LDLCALC, LDLDIRECT  Wt Readings from Last 3 Encounters:  06/16/20 235 lb (106.6 kg)  06/15/20 235 lb (106.6 kg)  06/07/19 235 lb (106.6 kg)    EKG was performed today and showed NSR with no ST changes  Objective:    Vital Signs:  BP 128/78   Pulse 72   Ht 5' 7"  (1.702 m)   Wt 235 lb (106.6 kg)   SpO2 97%   BMI 36.81 kg/m    GEN: Well nourished, well developed in no acute distress HEENT: Normal NECK: No JVD; No carotid  bruits LYMPHATICS: No lymphadenopathy CARDIAC:RRR, no murmurs, rubs, gallops RESPIRATORY:  Clear to auscultation without rales, wheezing or rhonchi  ABDOMEN: Soft, non-tender, non-distended MUSCULOSKELETAL:  No edema; No deformity  SKIN: Warm and dry NEUROLOGIC:  Alert and oriented x 3 PSYCHIATRIC:  Normal affect    ASSESSMENT & PLAN:    1.  OSA -  The patient is tolerating PAP therapy well without any problems. The PAP download was reviewed today and showed an AHI of 0.4/hr on 7 cm H2O with 100% compliance in using more than 4 hours nightly.  The patient has been using and benefiting from PAP use and will continue to benefit from therapy.   2. HTN -BBP is well controlled on exam -continue Lisinopril HCT 10-12.11m daily and Verapamil 30760mdaily -SCR stable at 1 and K+ 4.8 in May 2021  3.  Aortic stenosis -very mild AS with 60m55m mean AVG by echo 06/2018 -repeat 2D echo to make sure this is stable  4.  Obesity -I have encouraged her to get into a routine exercise program and cut back on carbs and portions.    Medication Adjustments/Labs and Tests Ordered: Current medicines are reviewed at length with the patient today.  Concerns regarding medicines are outlined above.  Tests Ordered: Orders Placed This Encounter  Procedures  . EKG 12-Lead   Medication Changes: No orders of the defined types were placed in this encounter.   Disposition:  Follow up in 1 year(s)  Signed, TraFransico HimD  06/16/2020 2:05 PM    [place]

## 2020-06-16 NOTE — Addendum Note (Signed)
Addended by: Antonieta Iba on: 06/16/2020 02:11 PM   Modules accepted: Orders

## 2020-06-16 NOTE — Progress Notes (Signed)
Lower extremity venous has been completed.   Preliminary results in CV Proc.   Abram Sander 06/16/2020 11:01 AM

## 2020-06-16 NOTE — Patient Instructions (Signed)
Medication Instructions:  Your physician recommends that you continue on your current medications as directed. Please refer to the Current Medication list given to you today.  *If you need a refill on your cardiac medications before your next appointment, please call your pharmacy*  Testing/Procedures: Your physician has requested that you have an echocardiogram. Echocardiography is a painless test that uses sound waves to create images of your heart. It provides your doctor with information about the size and shape of your heart and how well your heart's chambers and valves are working. This procedure takes approximately one hour. There are no restrictions for this procedure.  Follow-Up: At [facility], you and your health needs are our priority.  As part of our continuing mission to provide you with exceptional heart care, we have created designated Provider Care Teams.  These Care Teams include your primary Cardiologist (physician) and Advanced Practice Providers (APPs -  Physician Assistants and Nurse Practitioners) who all work together to provide you with the care you need, when you need it.  Your next appointment:   1 year(s)  The format for your next appointment:   In Person  Provider:   You may see Fransico Him, MD or one of the following Advanced Practice Providers on your designated Care Team:    Melina Copa, PA-C  Ermalinda Barrios, PA-C

## 2020-06-16 NOTE — ED Notes (Signed)
Visual Acuity Left 20/100 Right 20/100 With glasses in place.

## 2020-07-21 ENCOUNTER — Other Ambulatory Visit (HOSPITAL_COMMUNITY): Payer: Medicare Other

## 2020-08-05 ENCOUNTER — Other Ambulatory Visit: Payer: Self-pay

## 2020-08-05 ENCOUNTER — Ambulatory Visit (HOSPITAL_COMMUNITY): Payer: Medicare Other | Attending: Cardiology

## 2020-08-05 DIAGNOSIS — I350 Nonrheumatic aortic (valve) stenosis: Secondary | ICD-10-CM

## 2020-08-05 LAB — ECHOCARDIOGRAM COMPLETE
AR max vel: 1.28 cm2
AV Area VTI: 1.2 cm2
AV Area mean vel: 1.19 cm2
AV Mean grad: 10 mmHg
AV Peak grad: 14.4 mmHg
Ao pk vel: 1.9 m/s
Area-P 1/2: 2.39 cm2
S' Lateral: 3.1 cm

## 2020-08-06 ENCOUNTER — Other Ambulatory Visit: Payer: Self-pay | Admitting: Family Medicine

## 2020-08-06 DIAGNOSIS — Z1231 Encounter for screening mammogram for malignant neoplasm of breast: Secondary | ICD-10-CM

## 2020-08-11 ENCOUNTER — Encounter: Payer: Self-pay | Admitting: Cardiology

## 2020-08-11 DIAGNOSIS — I77819 Aortic ectasia, unspecified site: Secondary | ICD-10-CM | POA: Insufficient documentation

## 2020-09-22 ENCOUNTER — Other Ambulatory Visit: Payer: Self-pay

## 2020-09-22 ENCOUNTER — Ambulatory Visit
Admission: RE | Admit: 2020-09-22 | Discharge: 2020-09-22 | Disposition: A | Discharge status: Home / Self Care | Payer: Medicare Other | Source: Ambulatory Visit | Attending: Family Medicine | Admitting: Family Medicine

## 2020-09-22 DIAGNOSIS — Z1231 Encounter for screening mammogram for malignant neoplasm of breast: Secondary | ICD-10-CM

## 2021-06-18 ENCOUNTER — Ambulatory Visit: Payer: Medicare Other | Admitting: Cardiology

## 2021-08-23 ENCOUNTER — Other Ambulatory Visit: Payer: Self-pay | Admitting: Family Medicine

## 2021-08-23 DIAGNOSIS — Z1231 Encounter for screening mammogram for malignant neoplasm of breast: Secondary | ICD-10-CM

## 2021-09-07 ENCOUNTER — Encounter (INDEPENDENT_AMBULATORY_CARE_PROVIDER_SITE_OTHER): Payer: Self-pay

## 2021-09-07 ENCOUNTER — Ambulatory Visit: Payer: Medicare Other | Admitting: Cardiology

## 2021-09-07 ENCOUNTER — Encounter: Payer: Self-pay | Admitting: Cardiology

## 2021-09-07 ENCOUNTER — Other Ambulatory Visit: Payer: Self-pay | Admitting: *Deleted

## 2021-09-07 ENCOUNTER — Other Ambulatory Visit: Payer: Self-pay

## 2021-09-07 VITALS — BP 148/76 | HR 86 | Ht 67.0 in | Wt 245.6 lb

## 2021-09-07 DIAGNOSIS — I350 Nonrheumatic aortic (valve) stenosis: Secondary | ICD-10-CM

## 2021-09-07 DIAGNOSIS — I10 Essential (primary) hypertension: Secondary | ICD-10-CM | POA: Diagnosis not present

## 2021-09-07 DIAGNOSIS — G4733 Obstructive sleep apnea (adult) (pediatric): Secondary | ICD-10-CM | POA: Diagnosis not present

## 2021-09-07 LAB — BASIC METABOLIC PANEL
BUN/Creatinine Ratio: 17 (ref 12–28)
BUN: 17 mg/dL (ref 8–27)
CO2: 26 mmol/L (ref 20–29)
Calcium: 10.2 mg/dL (ref 8.7–10.3)
Chloride: 102 mmol/L (ref 96–106)
Creatinine, Ser: 0.98 mg/dL (ref 0.57–1.00)
Glucose: 101 mg/dL — ABNORMAL HIGH (ref 70–99)
Potassium: 4.5 mmol/L (ref 3.5–5.2)
Sodium: 142 mmol/L (ref 134–144)
eGFR: 59 mL/min/{1.73_m2} — ABNORMAL LOW (ref >59)

## 2021-09-07 NOTE — Patient Instructions (Signed)
Medication Instructions:  ?Your physician recommends that you continue on your current medications as directed. Please refer to the Current Medication list given to you today. ? ?*If you need a refill on your cardiac medications before your next appointment, please call your pharmacy* ? ?Lab Work: ?TODAY: BMET ?If you have labs (blood work) drawn today and your tests are completely normal, you will receive your results only by: ?MyChart Message (if you have MyChart) OR ?A paper copy in the mail ?If you have any lab test that is abnormal or we need to change your treatment, we will call you to review the results. ? ?Testing/Procedures: ?Your physician has requested that you have an echocardiogram in February 2024. Echocardiography is a painless test that uses sound waves to create images of your heart. It provides your doctor with information about the size and shape of your heart and how well your heart?s chambers and valves are working. This procedure takes approximately one hour. There are no restrictions for this procedure. ? ?Follow-Up: ?At [facility], you and your health needs are our priority.  As part of our continuing mission to provide you with exceptional heart care, we have created designated Provider Care Teams.  These Care Teams include your primary Cardiologist (physician) and Advanced Practice Providers (APPs -  Physician Assistants and Nurse Practitioners) who all work together to provide you with the care you need, when you need it. ? ? ?Your next appointment:   ?1 year(s) ? ?The format for your next appointment:   ?In Person ? ?Provider:   ?Fransico Him, MD       ? ?

## 2021-09-07 NOTE — Progress Notes (Signed)
?Date:  09/07/2021  ? ?ID:  Tina Elliott, DOB [birth year], MRN [id number] ? ?PCP:  [facility] [place]  ?Sleep medicine:  Fransico Him, MD  ?Electrophysiologist:  None  ? ?Chief Complaint:  OSA ? ?History of Present Illness:   ? ?Tina Elliott is a 76 y.o. female with a hx of HTN, mild AS and mild OSA on CPAP at 7cm H2O.   ? ?She is here today for followup and is doing well.  She denies any chest pain or pressure, SOB, DOE, PND, orthopnea, LE edema, dizziness or syncope. She rarely will feel a brief flutter in her heart.  She  is compliant with her meds and is tolerating meds with no SE.    ? ?She is doing well with her CPAP device and thinks that she has gotten used to it.  She tolerates the mask and feels the pressure is adequate.  Since going on CPAP she feels rested in the am and has no significant daytime sleepiness.  She denies any significant mouth or nasal congestion.  She does have nasal dryness and uses nasal saline spray. She does not think that he snores.   ?  ?Prior CV studies:   ?The following studies were reviewed today: ? ?PAP compliance download, EKG ? ?Past Medical History:  ?Diagnosis Date  ? Acquired dilation of ascending aorta and aortic root (HCC)   ? borderline at 62m by echo 07/2020  ? Aortic stenosis   ? mild by echo 07/2020  ? Celiac disease   ? Diastolic dysfunction   ? Hyperlipidemia   ? Hypertension   ? Hypothyroidism   ? OSA (obstructive sleep apnea)   ? mild with AHI 9.58/hr on CPAP at 7cm H2O  ? ?Past Surgical History:  ?Procedure Laterality Date  ? left knee sx    ? TRIGGER FINGER RELEASE Left 2017  ?  ? ?Current Meds  ?Medication Sig  ? ascorbic acid (VITAMIN C) 1000 MG tablet Take 1,000 mg by mouth daily.  ? aspirin EC 81 MG tablet Take 81 mg by mouth at bedtime.  ? cholecalciferol (VITAMIN D) 1000 UNITS tablet Take 1,000 Units by mouth daily.  ? diclofenac Sodium (VOLTAREN) 1 % GEL Apply 4 g topically 4 (four) times daily as needed (pain).  ?  famotidine (PEPCID) 20 MG tablet Take 20 mg by mouth daily.  ? hydrocortisone cream 1 % Apply 1 application topically 2 (two) times daily as needed (hemorrhoids).  ? levothyroxine (SYNTHROID) 88 MCG tablet Take 88 mcg by mouth See admin instructions. Takes 88 mcg on Saturday and Sunday.  ? levothyroxine (SYNTHROID, LEVOTHROID) 100 MCG tablet Take 100 mcg by mouth See admin instructions. 100 mcg weekday and 88 mcg weekends  ? lisinopril-hydrochlorothiazide (PRINZIDE,ZESTORETIC) 10-12.5 MG per tablet Take 1 tablet by mouth daily.  ? loratadine (CLARITIN) 10 MG tablet Take 10 mg by mouth daily.  ? NON FORMULARY CPAP MACHINE  ? Omega-3 Fatty Acids (FISH OIL) 1000 MG CAPS Take 1,000 mg by mouth daily.  ? pravastatin (PRAVACHOL) 40 MG tablet Take 40 mg by mouth daily.  ? sodium chloride (OCEAN) 0.65 % SOLN nasal spray Place 1 spray into both nostrils as needed for congestion.  ? Verapamil HCl CR 300 MG CP24 Take 300 mg by mouth daily.   ?  ? ?Allergies:   Wheat bran, Celebrex [celecoxib], and Gluten meal  ? ?Social History  ? ?Tobacco Use  ? Smoking status: Never  ? Smokeless tobacco: Never  ?  Vaping Use  ? Vaping Use: Never used  ?Substance Use Topics  ? Alcohol use: No  ?  Alcohol/week: 0.0 standard drinks  ? Drug use: No  ?  ? ?Family Hx: ?The patient's family history includes AAA (abdominal aortic aneurysm) in her brother; Heart attack in her father and mother. ? ?ROS:   ?Please see the history of present illness.    ? ?All other systems reviewed and are negative. ? ? ?Labs/Other Tests and Data Reviewed:   ? ?Recent Labs: ?No results found for requested labs within last 8760 hours.  ? ?Recent Lipid Panel ?No results found for: CHOL, TRIG, HDL, CHOLHDL, LDLCALC, LDLDIRECT ? ?Wt Readings from Last 3 Encounters:  ?09/07/21 245 lb 9.6 oz (111.4 kg)  ?06/16/20 235 lb (106.6 kg)  ?06/15/20 235 lb (106.6 kg)  ?  ?EKG was performed today and showed NSR with no ST changes ? ?Objective:   ? ?Vital Signs:  BP (!) 148/76   Pulse  86   Ht 5' 7"  (1.702 m)   Wt 245 lb 9.6 oz (111.4 kg)   SpO2 97%   BMI 38.47 kg/m?   ? ?GEN: Well nourished, well developed in no acute distress ?HEENT: Normal ?NECK: No JVD; No carotid bruits ?LYMPHATICS: No lymphadenopathy ?CARDIAC:RRR, no rubs, gallops.  2/6 SM at RUSB ?RESPIRATORY:  Clear to auscultation without rales, wheezing or rhonchi  ?ABDOMEN: Soft, non-tender, non-distended ?MUSCULOSKELETAL:  No edema; No deformity  ?SKIN: Warm and dry ?NEUROLOGIC:  Alert and oriented x 3 ?PSYCHIATRIC:  Normal affect   ? ?ASSESSMENT & PLAN:   ? ?1. OSA - The patient is tolerating PAP therapy well without any problems. The PAP download performed by his DME was personally reviewed and interpreted by me today and showed an AHI of 0.2/hr on 7 cm H2O with 100% compliance in using more than 4 hours nightly.  The patient has been using and benefiting from PAP use and will continue to benefit from therapy.  ? ? 2. HTN ?-BP is well controlled on exam today>>she just took her BP meds ?-continue prescription drug management with Lisinopril HCT 10-12.52m daily and Verapamil 3036mdaily with PRN refills ?-Check bmet ? ?3.  Aortic stenosis ?-2D echo 08/05/2020 showed mild aortic stenosis with mean aortic valve gradient 10 mmHg ?-Borderline enlargement of the -ascending aorta at 38 mm by echo ?-Repeat echo 08/2022 ? ? ?Medication Adjustments/Labs and Tests Ordered: ?Current medicines are reviewed at length with the patient today.  Concerns regarding medicines are outlined above.  ?Tests Ordered: ?Orders Placed This Encounter  ?Procedures  ? EKG 12-Lead  ? ?Medication Changes: ?No orders of the defined types were placed in this encounter. ? ? ?Disposition:  Follow up in 1 year(s) ? ?Signed, ?TrFransico HimMD  ?09/07/2021 9:49 AM    ?[place]

## 2021-09-07 NOTE — Addendum Note (Signed)
Addended by: Antonieta Iba on: 09/07/2021 09:56 AM ? ? Modules accepted: Orders ? ?

## 2021-09-24 ENCOUNTER — Ambulatory Visit
Admission: RE | Admit: 2021-09-24 | Discharge: 2021-09-24 | Disposition: A | Discharge status: Home / Self Care | Payer: Medicare Other | Source: Ambulatory Visit | Attending: Family Medicine | Admitting: Family Medicine

## 2021-09-24 DIAGNOSIS — Z1231 Encounter for screening mammogram for malignant neoplasm of breast: Secondary | ICD-10-CM

## 2022-02-15 ENCOUNTER — Encounter: Payer: Self-pay | Admitting: Podiatry

## 2022-02-15 ENCOUNTER — Ambulatory Visit: Payer: Medicare Other | Admitting: Podiatry

## 2022-02-15 DIAGNOSIS — L84 Corns and callosities: Secondary | ICD-10-CM | POA: Diagnosis not present

## 2022-02-15 DIAGNOSIS — B351 Tinea unguium: Secondary | ICD-10-CM

## 2022-02-15 DIAGNOSIS — M205X1 Other deformities of toe(s) (acquired), right foot: Secondary | ICD-10-CM | POA: Diagnosis not present

## 2022-02-15 MED ORDER — CICLOPIROX 8 % EX SOLN: Freq: Every day | CUTANEOUS | 0 refills | Status: DC

## 2022-02-15 NOTE — Progress Notes (Signed)
  Subjective:  Patient ID: Tina Elliott, female    DOB: 05-03-1946,   MRN: [id number]  Chief Complaint  Patient presents with   Nail Problem    Rm 22 Bilateral nail trim with possible fungus. Bilateral nails 1-5   Toe Pain    Right great toe stiffness. Pt has arthritis.    Callouses    Callus of right plantar region of foot.     76 y.o. female presents for concern of nail fungus and calluses on the foot. Also relates right great toe stiffness and pain. Denies any current treatments. She is not diabetic and not on any blood thinners.  . Denies any other pedal complaints. Denies n/v/f/c.   CPP: Faustino Congress PCP   Past Medical History:  Diagnosis Date   Acquired dilation of ascending aorta and aortic root (HCC)    borderline at 56m by echo 07/2020   Aortic stenosis    mild by echo 07/2020   Celiac disease    Diastolic dysfunction    Hyperlipidemia    Hypertension    Hypothyroidism    OSA (obstructive sleep apnea)    mild with AHI 9.58/hr on CPAP at 7cm H2O    Objective:  Physical Exam: Vascular: DP/PT pulses 2/4 bilateral. CFT <3 seconds. Normal hair growth on digits. No edema.  Skin. No lacerations or abrasions bilateral feet. Nails 1-5 b/l are thickened and elongated with subungual debris. There is a hyperkeratotic area noted to distal left third digit.  Musculoskeletal: MMT 5/5 bilateral lower extremities in DF, PF, Inversion and Eversion. Deceased ROM in DF of ankle joint. Some pain with plantar flexion of the first MPJ.  Neurological: Sensation intact to light touch.   Assessment:   1. Onychomycosis   2. Callus of foot   3. Hallux limitus of right foot      Plan:  Patient was evaluated and treated and all questions answered. -Examined patient -Discussed treatment options for painful dystrophic nails  -Discussed fungal nail treatment options including oral, topical, and laser treatments.  -Nails 1-5 b/l debrided as courtesy without incident.   -Hyperkeratotic tissue debrided as courtesy with #15 blade without inicidnet. -Penlac sent to pharmacy   -Discussed hallux limitus and  treatement options; conservative and  Surgical management; risks, benefits, alternatives discussed. All patient's questions answered. -Discussed anti-inflammatories.  -Recommend continue with good supportive shoes and inserts. Discussed stiff soled shoes and use of carbon fiber foot plate.   -Patient to return to office as needed or sooner if condition worsens.    RLorenda Peck DPM

## 2022-02-28 ENCOUNTER — Other Ambulatory Visit: Payer: Self-pay | Admitting: Family Medicine

## 2022-02-28 DIAGNOSIS — M8580 Other specified disorders of bone density and structure, unspecified site: Secondary | ICD-10-CM

## 2022-04-22 ENCOUNTER — Emergency Department (HOSPITAL_BASED_OUTPATIENT_CLINIC_OR_DEPARTMENT_OTHER): Payer: Medicare Other | Admitting: Radiology

## 2022-04-22 ENCOUNTER — Other Ambulatory Visit: Payer: Self-pay

## 2022-04-22 ENCOUNTER — Encounter (HOSPITAL_BASED_OUTPATIENT_CLINIC_OR_DEPARTMENT_OTHER): Payer: Self-pay

## 2022-04-22 DIAGNOSIS — M25561 Pain in right knee: Secondary | ICD-10-CM | POA: Insufficient documentation

## 2022-04-22 DIAGNOSIS — Z7982 Long term (current) use of aspirin: Secondary | ICD-10-CM | POA: Diagnosis not present

## 2022-04-22 DIAGNOSIS — Z79899 Other long term (current) drug therapy: Secondary | ICD-10-CM | POA: Insufficient documentation

## 2022-04-22 NOTE — ED Triage Notes (Signed)
Patient arrives with family POV from home c/o right knee pain. Pt reports getting up "just before 6pm this evening when she heard the back of her right knee pop." Pt iced it and took 400 mg ibuprofen. Pt reports "pressure" in right knee for the past 1-2 months. Pt a&o x4, no acute distress.

## 2022-04-23 ENCOUNTER — Emergency Department (HOSPITAL_BASED_OUTPATIENT_CLINIC_OR_DEPARTMENT_OTHER)
Admission: EM | Admit: 2022-04-23 | Discharge: 2022-04-23 | Disposition: A | Discharge status: Home / Self Care | Payer: Medicare Other | Attending: Emergency Medicine | Admitting: Emergency Medicine

## 2022-04-23 DIAGNOSIS — M25561 Pain in right knee: Secondary | ICD-10-CM

## 2022-04-23 MED ORDER — IBUPROFEN 400 MG PO TABS
600.0000 mg | ORAL_TABLET | Freq: Once | ORAL | Status: AC
  Administered 2022-04-23: 600 mg via ORAL
  Filled 2022-04-23: qty 1

## 2022-04-23 MED ORDER — IBUPROFEN 600 MG PO TABS: 600.0000 mg | ORAL_TABLET | Freq: Four times a day (QID) | ORAL | 0 refills | Status: AC | PRN

## 2022-04-23 MED ORDER — DICLOFENAC SODIUM 1 % EX GEL
4.0000 g | Freq: Four times a day (QID) | CUTANEOUS | 0 refills | Status: AC | PRN
Start: 2022-04-23 — End: ?

## 2022-04-23 NOTE — ED Provider Notes (Signed)
[facility] EMERGENCY DEPT  Provider Note  CSN: [id number] Arrival date & time: 04/22/22 1904  History Chief Complaint  Patient presents with   Knee Injury    Tina Elliott is a 76 y.o. female reports she felt a pop and pain in her R knee with standing up from the couch earlier in the day. Pain is worse with bending, able to bear weight with assistance but not able to walk well. No falls or direct injury.    Home Medications Prior to Admission medications   Medication Sig Start Date End Date Taking? Authorizing Provider  ibuprofen (ADVIL) 600 MG tablet Take 1 tablet (600 mg total) by mouth every 6 (six) hours as needed. 04/23/22  Yes Truddie Hidden, MD  ascorbic acid (VITAMIN C) 1000 MG tablet Take 1,000 mg by mouth daily.    [provider]  aspirin EC 81 MG tablet Take 81 mg by mouth at bedtime.    [provider]  cholecalciferol (VITAMIN D) 1000 UNITS tablet Take 1,000 Units by mouth daily.    [provider]  ciclopirox (PENLAC) 8 % solution Apply topically at bedtime. Apply over nail and surrounding skin. Apply daily over previous coat. After seven (7) days, may remove with alcohol and continue cycle. 02/15/22   Lorenda Peck, DPM  diclofenac Sodium (VOLTAREN) 1 % GEL Apply 4 g topically 4 (four) times daily as needed (pain). 04/23/22   Truddie Hidden, MD  famotidine (PEPCID) 20 MG tablet Take 20 mg by mouth daily.    [provider]  hydrocortisone cream 1 % Apply 1 application topically 2 (two) times daily as needed (hemorrhoids).    [provider]  levothyroxine (SYNTHROID, LEVOTHROID) 100 MCG tablet Take 100 mcg by mouth See admin instructions. 100 mcg weekday and 88 mcg weekends    [provider]  lisinopril-hydrochlorothiazide (PRINZIDE,ZESTORETIC) 10-12.5 MG per tablet Take 1 tablet by mouth daily.    [provider]  loratadine (CLARITIN) 10 MG tablet Take 10 mg by mouth daily.     [provider]  NON FORMULARY CPAP MACHINE    [provider]  Omega-3 Fatty Acids (FISH OIL) 1000 MG CAPS Take 1,000 mg by mouth daily.    [provider]  pravastatin (PRAVACHOL) 40 MG tablet Take 40 mg by mouth daily.    [provider]  sodium chloride (OCEAN) 0.65 % SOLN nasal spray Place 1 spray into both nostrils as needed for congestion.    [provider]  Verapamil HCl CR 300 MG CP24 Take 300 mg by mouth daily.     [provider]     Allergies    Wheat bran, Celebrex [celecoxib], and Gluten meal   Review of Systems   Review of Systems Please see HPI for pertinent positives and negatives  Physical Exam BP (!) 123/52   Pulse 62   Temp 98 F (36.7 C) (Oral)   Resp 18   Ht 5' 7"  (1.702 m)   Wt 108.9 kg   SpO2 95%   BMI 37.59 kg/m   Physical Exam Vitals and nursing note reviewed.  HENT:     Head: Normocephalic.     Nose: Nose normal.  Eyes:     Extraocular Movements: Extraocular movements intact.  Cardiovascular:     Pulses: Normal pulses.  Pulmonary:     Effort: Pulmonary effort is normal.  Musculoskeletal:        General: Tenderness (medial R knee) present. No swelling  or deformity.     Cervical back: Neck supple.     Comments: ROM decreased due to pain  Skin:    Findings: No rash (on exposed skin).  Neurological:     Mental Status: She is alert and oriented to person, place, and time.  Psychiatric:        Mood and Affect: Mood normal.     ED Results / Procedures / Treatments   EKG None  Procedures Procedures  Medications Ordered in the ED Medications  ibuprofen (ADVIL) tablet 600 mg (has no administration in time range)    Initial Impression and Plan  Patient here with atraumatic knee pain, felt a pop, but did not fall or have any direct trauma. I personally viewed the images from radiology studies and agree with radiologist interpretation: Xray is neg for fracture, does show OA, plan  knee brace, NSAID/APAP for pain, use walker at home and follow up with Ortho. She declines any stronger pain medications.    ED Course       MDM Rules/Calculators/A&P Medical Decision Making Problems Addressed: Acute pain of right knee: acute illness or injury  Amount and/or Complexity of Data Reviewed Radiology: ordered and independent interpretation performed. Decision-making details documented in ED Course.  Risk Prescription drug management.    Final Clinical Impression(s) / ED Diagnoses Final diagnoses:  Acute pain of right knee    Rx / DC Orders ED Discharge Orders          Ordered    ibuprofen (ADVIL) 600 MG tablet  Every 6 hours PRN        04/23/22 0149    diclofenac Sodium (VOLTAREN) 1 % GEL  4 times daily PRN        04/23/22 0149             Truddie Hidden, MD 04/23/22 [phone]

## 2022-08-10 ENCOUNTER — Encounter: Payer: Self-pay | Admitting: Cardiology

## 2022-08-10 ENCOUNTER — Ambulatory Visit (HOSPITAL_COMMUNITY): Payer: Medicare Other | Attending: Cardiovascular Disease

## 2022-08-10 DIAGNOSIS — I350 Nonrheumatic aortic (valve) stenosis: Secondary | ICD-10-CM | POA: Insufficient documentation

## 2022-08-10 LAB — ECHOCARDIOGRAM COMPLETE
AR max vel: 1.71 cm2
AV Area VTI: 1.56 cm2
AV Area mean vel: 1.63 cm2
AV Mean grad: 12.6 mmHg
AV Peak grad: 21.9 mmHg
Ao pk vel: 2.34 m/s
Area-P 1/2: 3.42 cm2
S' Lateral: 2.9 cm

## 2022-08-12 ENCOUNTER — Telehealth: Payer: Self-pay

## 2022-08-12 DIAGNOSIS — I350 Nonrheumatic aortic (valve) stenosis: Secondary | ICD-10-CM

## 2022-08-12 NOTE — Telephone Encounter (Signed)
-----   Message from Sueanne Margarita, MD sent at 08/10/2022  4:03 PM EST ----- Echo showed normal heart function with EF 60 to 65% with mildly thickened heart muscle and increased thickness of the heart consistent with advanced age.  The mitral valve is mildly leaky and the aortic valve is calcified with mild aortic stenosis.  Mean aortic valve gradient 12 mmHg.  Repeat echo in 1 year for aortic stenosis

## 2022-08-17 ENCOUNTER — Other Ambulatory Visit: Payer: Self-pay | Admitting: Family Medicine

## 2022-08-17 DIAGNOSIS — Z1231 Encounter for screening mammogram for malignant neoplasm of breast: Secondary | ICD-10-CM

## 2022-08-18 ENCOUNTER — Ambulatory Visit
Admission: RE | Admit: 2022-08-18 | Discharge: 2022-08-18 | Disposition: A | Discharge status: Home / Self Care | Payer: Medicare Other | Source: Ambulatory Visit | Attending: Family Medicine | Admitting: Family Medicine

## 2022-08-18 DIAGNOSIS — M8580 Other specified disorders of bone density and structure, unspecified site: Secondary | ICD-10-CM

## 2022-09-30 ENCOUNTER — Ambulatory Visit
Admission: RE | Admit: 2022-09-30 | Discharge: 2022-09-30 | Disposition: A | Discharge status: Home / Self Care | Payer: Medicare Other | Source: Ambulatory Visit | Attending: Family Medicine | Admitting: Family Medicine

## 2022-09-30 DIAGNOSIS — Z1231 Encounter for screening mammogram for malignant neoplasm of breast: Secondary | ICD-10-CM

## 2022-11-02 ENCOUNTER — Telehealth: Payer: Self-pay | Admitting: Cardiology

## 2022-11-02 DIAGNOSIS — G4733 Obstructive sleep apnea (adult) (pediatric): Secondary | ICD-10-CM

## 2022-11-02 DIAGNOSIS — I10 Essential (primary) hypertension: Secondary | ICD-10-CM

## 2022-11-02 NOTE — Telephone Encounter (Signed)
Patient calling in to discuss concerns about her cpap machine. She states the machine (not the mask or tubing) seems to make a whoosing sound when she breathes in and out. She states if she breathes too hard it starts to make a high pitched noise that is difficult to sleep through. She states she just got new supplies a few months ago but machine is 77 years old. She also states she changes the water in her machine frequently. Forwarded to Dr. Mayford Knife and sleep team.

## 2022-11-02 NOTE — Telephone Encounter (Signed)
Patient calling in because she believes she needs another machine. States that she needs  Dr Mayford Knife to right a note. Please advide

## 2022-11-03 NOTE — Telephone Encounter (Signed)
Per Dr Mayford Knife, Order new ResMed CPAP at 7cm H2O with heated humidity and mask of choice   Order placed to [facility] via community message.

## 2022-11-03 NOTE — Addendum Note (Signed)
Addended by: Reesa Chew on: 11/03/2022 06:40 PM   Modules accepted: Orders

## 2022-11-16 ENCOUNTER — Ambulatory Visit: Payer: Medicare Other | Admitting: Cardiology

## 2022-11-17 ENCOUNTER — Ambulatory Visit: Payer: Medicare Other | Attending: Cardiology | Admitting: Cardiology

## 2022-11-17 ENCOUNTER — Encounter: Payer: Self-pay | Admitting: Cardiology

## 2022-11-17 ENCOUNTER — Telehealth: Payer: Self-pay | Admitting: *Deleted

## 2022-11-17 VITALS — BP 132/60 | HR 72 | Ht 66.5 in | Wt 244.4 lb

## 2022-11-17 DIAGNOSIS — Z79899 Other long term (current) drug therapy: Secondary | ICD-10-CM

## 2022-11-17 DIAGNOSIS — I350 Nonrheumatic aortic (valve) stenosis: Secondary | ICD-10-CM

## 2022-11-17 DIAGNOSIS — G4733 Obstructive sleep apnea (adult) (pediatric): Secondary | ICD-10-CM | POA: Diagnosis not present

## 2022-11-17 DIAGNOSIS — I10 Essential (primary) hypertension: Secondary | ICD-10-CM

## 2022-11-17 NOTE — Addendum Note (Signed)
Addended by: Luellen Pucker on: 11/17/2022 10:46 AM   Modules accepted: Orders

## 2022-11-17 NOTE — Patient Instructions (Signed)
Medication Instructions:  Your physician recommends that you continue on your current medications as directed. Please refer to the Current Medication list given to you today.  *If you need a refill on your cardiac medications before your next appointment, please call your pharmacy*   Lab Work: Please complete a BMET in our lab today before you leave.   If you have labs (blood work) drawn today and your tests are completely normal, you will receive your results only by: MyChart Message (if you have MyChart) OR A paper copy in the mail If you have any lab test that is abnormal or we need to change your treatment, we will call you to review the results.   Testing/Procedures: Your physician has requested that you have an echocardiogram. Echocardiography is a painless test that uses sound waves to create images of your heart. It provides your doctor with information about the size and shape of your heart and how well your heart's chambers and valves are working. This procedure takes approximately one hour. There are no restrictions for this procedure. Please do NOT wear cologne, perfume, aftershave, or lotions (deodorant is allowed). Please arrive 15 minutes prior to your appointment time.    Follow-Up: At [facility], you and your health needs are our priority.  As part of our continuing mission to provide you with exceptional heart care, we have created designated Provider Care Teams.  These Care Teams include your primary Cardiologist (physician) and Advanced Practice Providers (APPs -  Physician Assistants and Nurse Practitioners) who all work together to provide you with the care you need, when you need it.  We recommend signing up for the patient portal called "MyChart".  Sign up information is provided on this After Visit Summary.  MyChart is used to connect with patients for Virtual Visits (Telemedicine).  Patients are able to view lab/test results, encounter notes, upcoming  appointments, etc.  Non-urgent messages can be sent to your provider as well.   To learn more about what you can do with MyChart, go to [email or web address].    Your next appointment will be dependent on delivery of your new cpap equipment and it will be with:     Provider:   Armanda Magic, MD

## 2022-11-17 NOTE — Progress Notes (Signed)
Date:  11/17/2022   ID:  Tina Elliott, DOB [birth year], MRN [id number]  PCP:  Moshe Cipro, NP  Sleep medicine:  Armanda Magic, MD  Electrophysiologist:  None   Chief Complaint:  OSA  History of Present Illness:    Tina Elliott is a 77 y.o. female with a hx of HTN, mild AS and mild OSA on CPAP at 7cm H2O.    She is here today for followup and is doing well.  She denies any chest pain or pressure, SOB, DOE, PND, orthopnea, LE edema, dizziness, palpitations or syncope. She is compliant with her meds and is tolerating meds with no SE.    She is doing well with her PAP device and thinks that she has gotten used to it.  She tolerates the mask and feels the pressure is adequate.  Since going on PAP she feels rested in the am and has no significant daytime sleepiness if she slept well the night before.  She denies any significant mouth or nasal dryness or nasal congestion.  She does not think that she snores.  She tells me that her device is making a clicking problem.  Her device is older than 79 years old and she would like to get a new device.   Prior CV studies:   The following studies were reviewed today:  PAP compliance download, EKG  Past Medical History:  Diagnosis Date   Acquired dilation of ascending aorta and aortic root (HCC)    borderline at 38mm by echo 07/2020   Aortic stenosis    mild with mean aortic valve gradient 12 mmHg by echo 08/2022   Celiac disease    Diastolic dysfunction    Hyperlipidemia    Hypertension    Hypothyroidism    OSA (obstructive sleep apnea)    mild with AHI 9.58/hr on CPAP at 7cm H2O   Past Surgical History:  Procedure Laterality Date   left knee sx     TRIGGER FINGER RELEASE Left 2017     Current Meds  Medication Sig   ascorbic acid (VITAMIN C) 1000 MG tablet Take 1,000 mg by mouth daily.   aspirin EC 81 MG tablet Take 81 mg by mouth at bedtime.   cholecalciferol (VITAMIN D) 1000 UNITS tablet Take 1,000 Units by mouth  daily.   ciclopirox (PENLAC) 8 % solution Apply topically at bedtime. Apply over nail and surrounding skin. Apply daily over previous coat. After seven (7) days, may remove with alcohol and continue cycle.   diclofenac Sodium (VOLTAREN) 1 % GEL Apply 4 g topically 4 (four) times daily as needed (pain).   famotidine (PEPCID) 20 MG tablet Take 20 mg by mouth daily.   hydrocortisone cream 1 % Apply 1 application topically 2 (two) times daily as needed (hemorrhoids).   ibuprofen (ADVIL) 600 MG tablet Take 1 tablet (600 mg total) by mouth every 6 (six) hours as needed.   levothyroxine (SYNTHROID) 112 MCG tablet Take 112 mcg by mouth every morning.   lisinopril-hydrochlorothiazide (PRINZIDE,ZESTORETIC) 10-12.5 MG per tablet Take 1 tablet by mouth daily.   loratadine (CLARITIN) 10 MG tablet Take 10 mg by mouth daily.   NON FORMULARY CPAP MACHINE   Omega-3 Fatty Acids (FISH OIL) 1000 MG CAPS Take 1,000 mg by mouth daily.   pravastatin (PRAVACHOL) 40 MG tablet Take 40 mg by mouth daily.   sodium chloride (OCEAN) 0.65 % SOLN nasal spray Place 1 spray into both nostrils as needed for congestion.   Verapamil HCl CR  300 MG CP24 Take 300 mg by mouth daily.      Allergies:   Wheat, Celebrex [celecoxib], and Gluten meal   Social History   Tobacco Use   Smoking status: Never   Smokeless tobacco: Never  Vaping Use   Vaping Use: Never used  Substance Use Topics   Alcohol use: No    Alcohol/week: 0.0 standard drinks of alcohol   Drug use: No     Family Hx: The patient's family history includes AAA (abdominal aortic aneurysm) in her brother; Heart attack in her father and mother. There is no history of Breast cancer.  ROS:   Please see the history of present illness.     All other systems reviewed and are negative.   Labs/Other Tests and Data Reviewed:    Recent Labs: No results found for requested labs within last 365 days.   Recent Lipid Panel No results found for: "CHOL", "TRIG", "HDL",  "CHOLHDL", "LDLCALC", "LDLDIRECT"  Wt Readings from Last 3 Encounters:  11/17/22 244 lb 6.4 oz (110.9 kg)  04/22/22 240 lb (108.9 kg)  09/07/21 245 lb 9.6 oz (111.4 kg)    EKG was not performed today   Objective:    Vital Signs:  BP 132/60   Pulse 72   Ht 5' 6.5" (1.689 m)   Wt 244 lb 6.4 oz (110.9 kg)   SpO2 98%   BMI 38.86 kg/m    GEN: Well nourished, well developed in no acute distress HEENT: Normal NECK: No JVD; No carotid bruits LYMPHATICS: No lymphadenopathy CARDIAC:RRR, no rubs, gallops 2/6 SM at RUSB to LLSB and into bilateral carotid arteries RESPIRATORY:  Clear to auscultation without rales, wheezing or rhonchi  ABDOMEN: Soft, non-tender, non-distended MUSCULOSKELETAL:  No edema; No deformity  SKIN: Warm and dry NEUROLOGIC:  Alert and oriented x 3 PSYCHIATRIC:  Normal affect  ASSESSMENT & PLAN:    1. OSA - The patient is tolerating PAP therapy well without any problems. The PAP download performed by his DME was personally reviewed and interpreted by me today and showed an AHI of 0.1 /hr on 7 cm H2O with 90 % compliance in using more than 4 hours nightly.  The patient has been using and benefiting from PAP use and will continue to benefit from therapy.  -will order a new ResMed CPAP at 7cm H2O with heated humidity and mask of choice.   2. HTN -BP is adequately controlled on exam today -Continue prescription drug management with lisinopril HCT 10/12.5 mg daily, verapamil 300 mg daily with as needed refills -Check bmet today  3.  Aortic stenosis -2D echo 08/05/2020 showed mild aortic stenosis with mean aortic valve gradient 10 mmHg -Repeat 2D echo 08/23/2022 showed EF 60 to 65% with mild LVH, grade 1 diastolic dysfunction, mild MR and mild aortic valve stenosis with mean gradient 12.6 mmHg, V-max 2.34 m/s and AVA by VTI 1.56 cm -Repeat 2D echo in 1 year  Medication Adjustments/Labs and Tests Ordered: Current medicines are reviewed at length with the patient today.   Concerns regarding medicines are outlined above.  Tests Ordered: No orders of the defined types were placed in this encounter.  Medication Changes: No orders of the defined types were placed in this encounter.   Disposition:  Follow up in 1 year(s)  Signed, Armanda Magic, MD  11/17/2022 10:29 AM     [facility]

## 2022-11-17 NOTE — Telephone Encounter (Signed)
Per Dr Mayford Knife, order a new ResMed CPAP at 7cm H2O with heated humidity and mask of choice.     Order placed to [facility] via

## 2022-11-18 LAB — BASIC METABOLIC PANEL
BUN/Creatinine Ratio: 17 (ref 12–28)
BUN: 16 mg/dL (ref 8–27)
CO2: 24 mmol/L (ref 20–29)
Calcium: 9.9 mg/dL (ref 8.7–10.3)
Chloride: 102 mmol/L (ref 96–106)
Creatinine, Ser: 0.94 mg/dL (ref 0.57–1.00)
Glucose: 110 mg/dL — ABNORMAL HIGH (ref 70–99)
Potassium: 4.9 mmol/L (ref 3.5–5.2)
Sodium: 140 mmol/L (ref 134–144)
eGFR: 62 mL/min/{1.73_m2} (ref >59)

## 2022-11-22 IMAGING — MG MM DIGITAL SCREENING BILAT W/ TOMO AND CAD
6 of 10 series · 6 of 30 positions shown · non-contrast
Comparison: Previous exam(s).

CLINICAL DATA: Screening.

EXAM:
DIGITAL SCREENING BILATERAL MAMMOGRAM WITH TOMOSYNTHESIS AND CAD
TECHNIQUE: Bilateral screening digital craniocaudal and mediolateral oblique
mammograms were obtained. Bilateral screening digital breast
tomosynthesis was performed. The images were evaluated with
computer-aided detection.

[L CC synth-2D (1 of 2)]
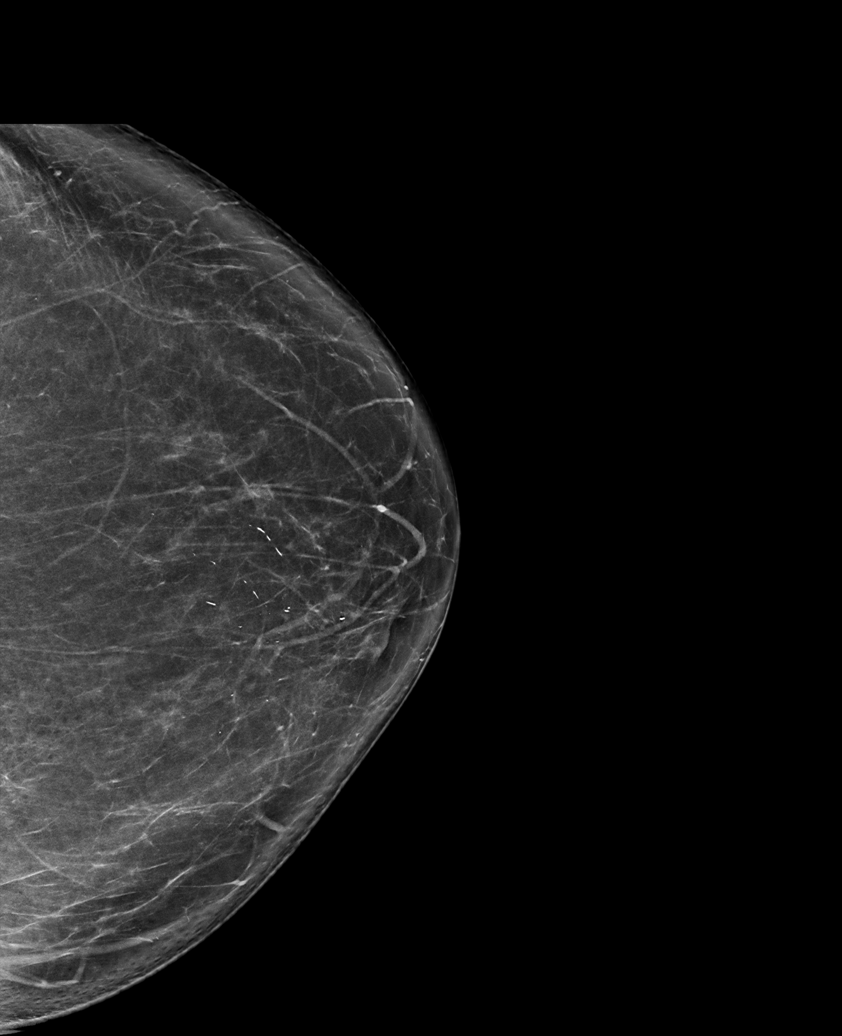

[L MLO synth-2D]
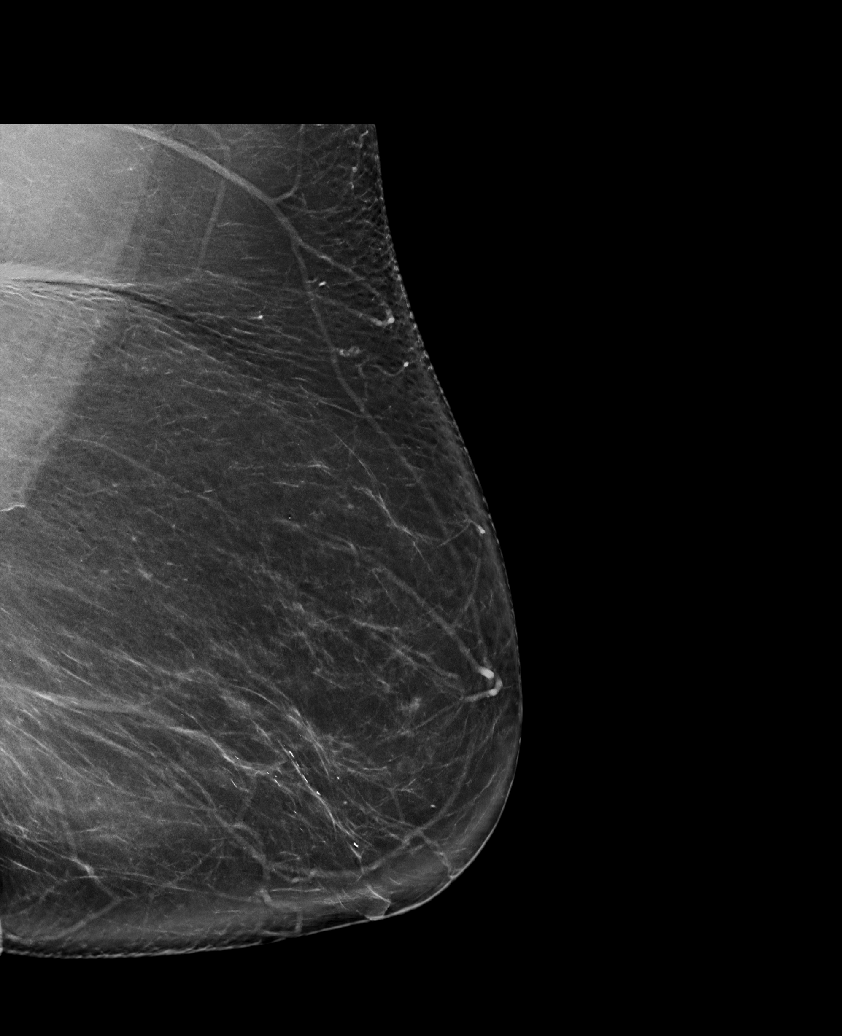

[L CC synth-2D (2 of 2)]
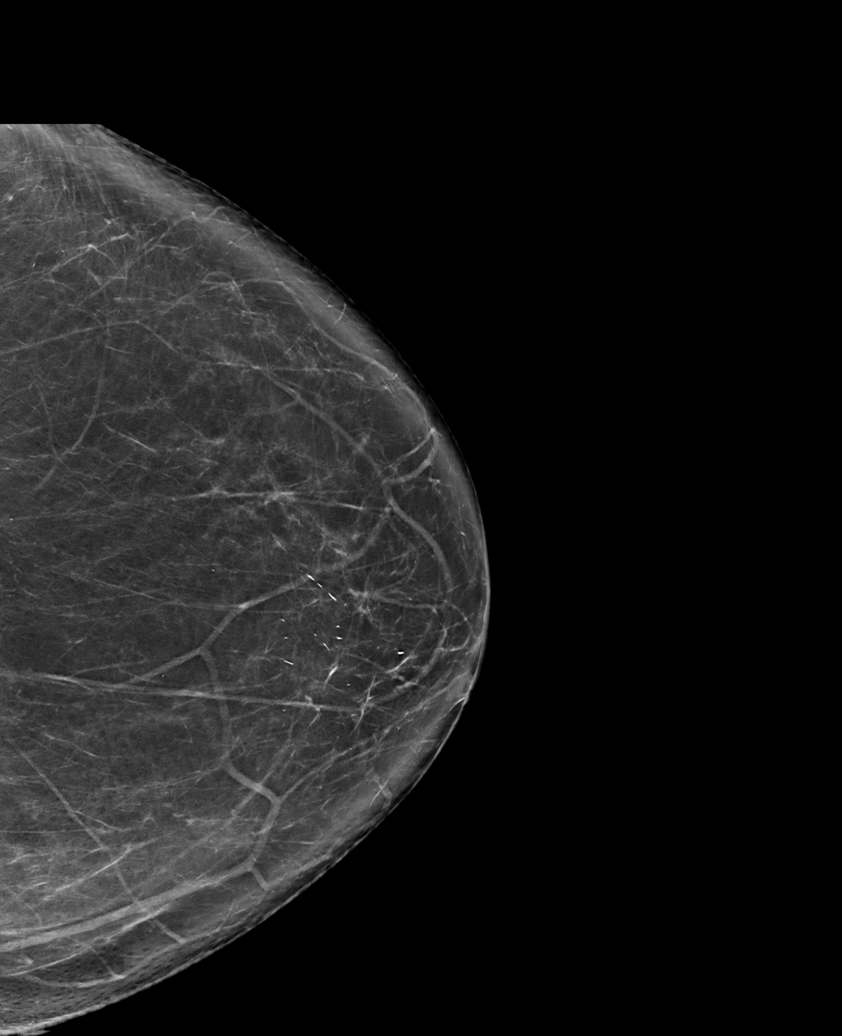

[R CC synth-2D]
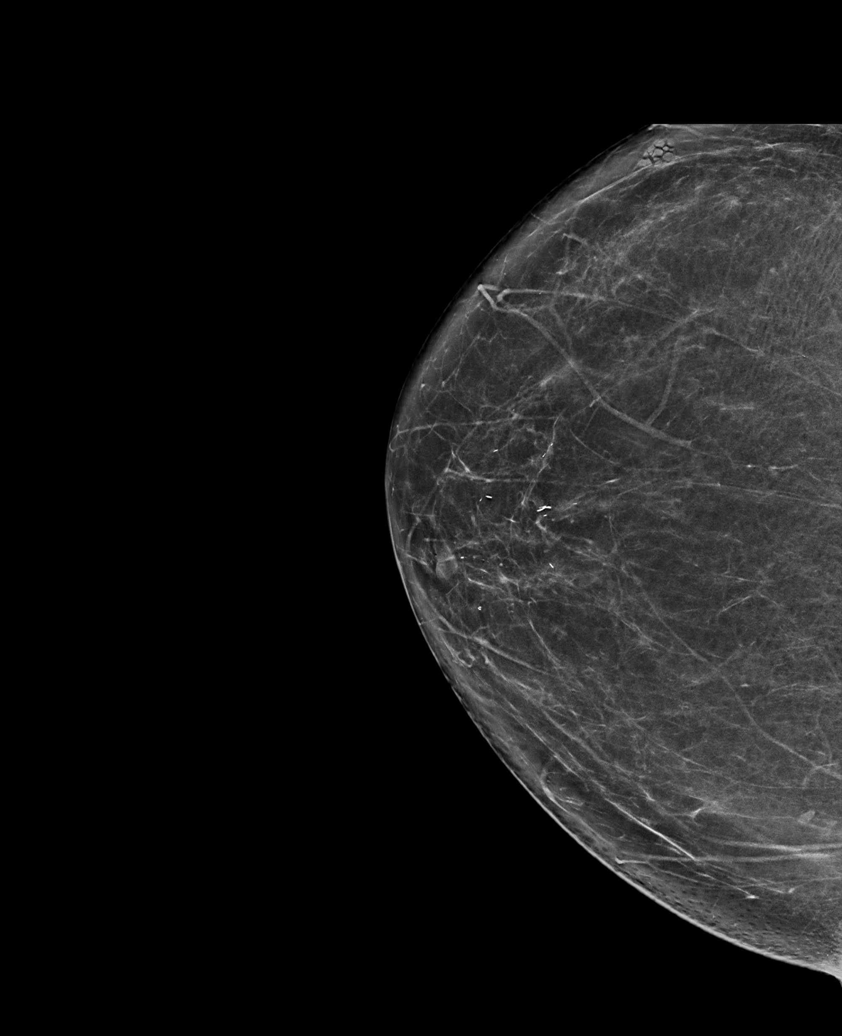

[R MLO synth-2D]
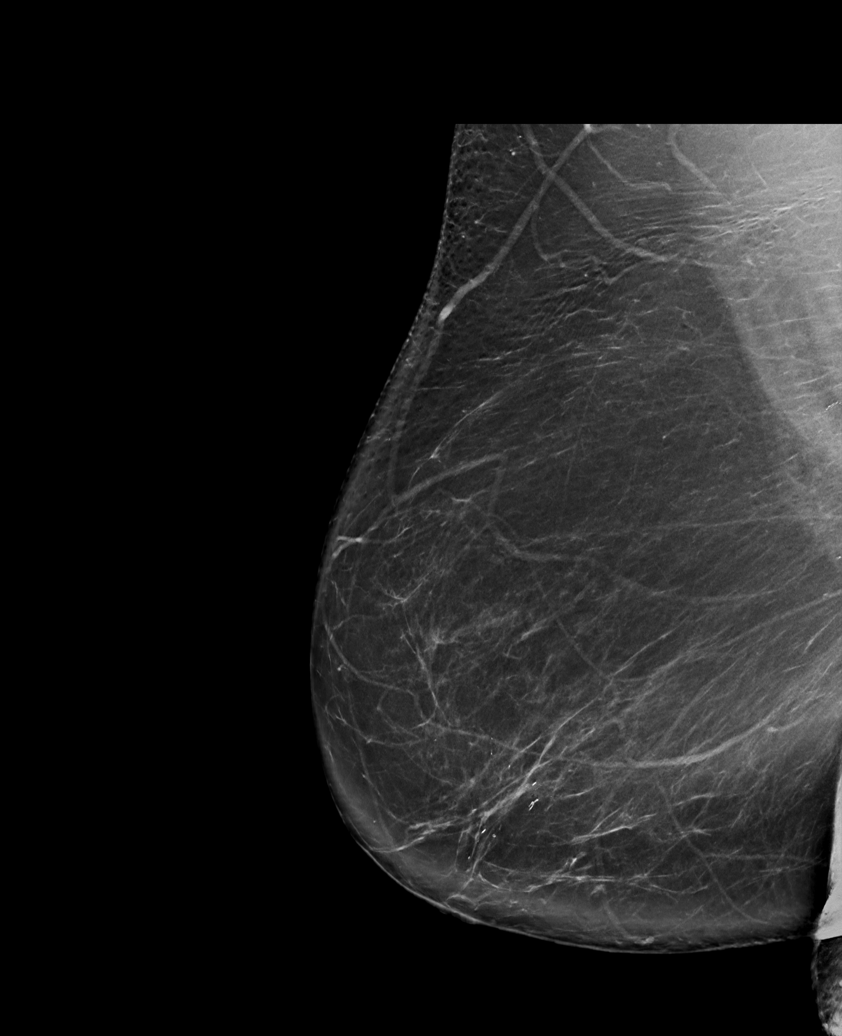

[L CC tomo · tomo slice 46/91.0]
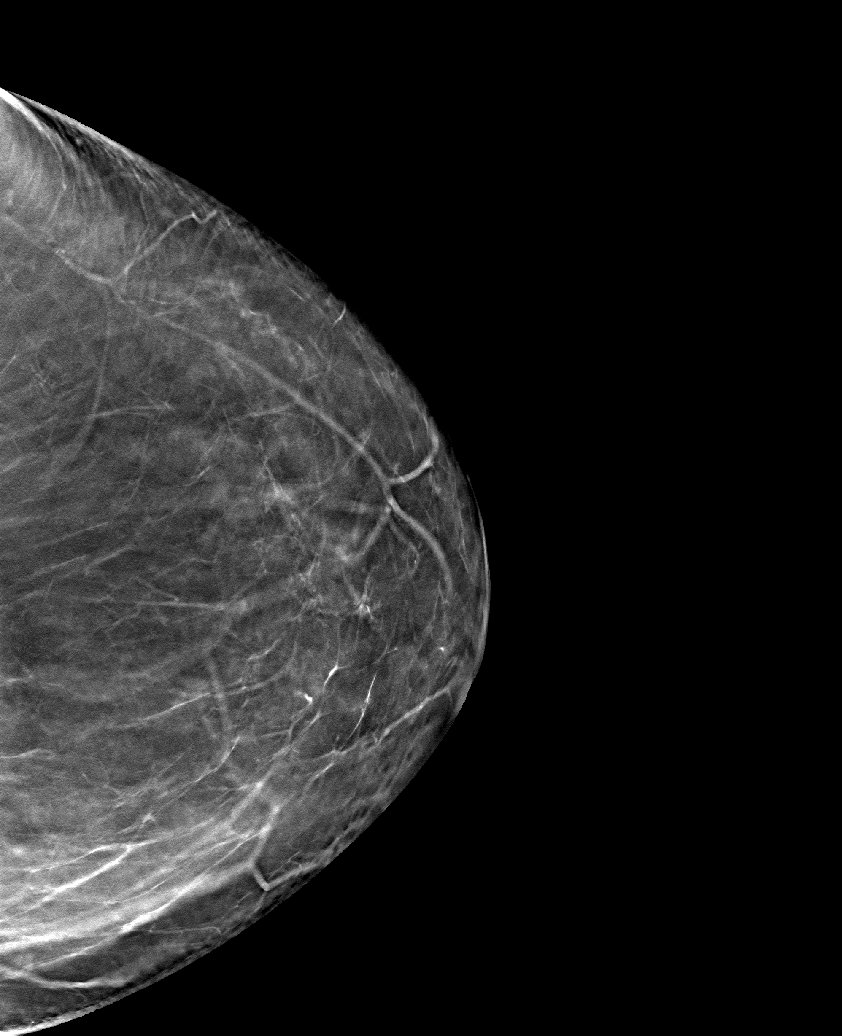

[6 of 30 positions shown; findings below may reference images not displayed]

ACR Breast Density Category b: There are scattered areas of
fibroglandular density.
FINDINGS: There are no findings suspicious for malignancy.
IMPRESSION: No mammographic evidence of malignancy. A result letter of this
screening mammogram will be mailed directly to the patient.

RECOMMENDATION:
Screening mammogram in one year. (Code:51-O-LD2)

BI-RADS CATEGORY  1: Negative.

## 2023-02-09 ENCOUNTER — Encounter: Payer: Self-pay | Admitting: Cardiology

## 2023-02-09 ENCOUNTER — Ambulatory Visit: Payer: Medicare Other | Attending: Cardiology | Admitting: Cardiology

## 2023-02-09 ENCOUNTER — Telehealth: Payer: Self-pay | Admitting: *Deleted

## 2023-02-09 VITALS — BP 135/64 | HR 93 | Ht 66.5 in | Wt 244.6 lb

## 2023-02-09 DIAGNOSIS — I10 Essential (primary) hypertension: Secondary | ICD-10-CM

## 2023-02-09 DIAGNOSIS — G4733 Obstructive sleep apnea (adult) (pediatric): Secondary | ICD-10-CM

## 2023-02-09 DIAGNOSIS — I350 Nonrheumatic aortic (valve) stenosis: Secondary | ICD-10-CM

## 2023-02-09 NOTE — Telephone Encounter (Signed)
-----   Message from Nurse Alcario Drought E sent at 02/09/2023 10:32 AM EDT ----- Regarding: cpap supplies Dr. Mayford Knife would like to order cpap supplies for this patient. Alcario Drought, RN

## 2023-02-09 NOTE — Progress Notes (Signed)
Date:  02/09/2023   ID:  Tina Elliott, DOB 1946/05/14, MRN [id number]  PCP:  Moshe Cipro, NP  Sleep medicine:  Armanda Magic, MD  Electrophysiologist:  None   Chief Complaint:  OSA  History of Present Illness:    Tina Elliott is a 77 y.o. female with a hx of HTN, mild AS and mild OSA on CPAP at 7cm H2O.    She is here today for followup and is doing well.  She denies any chest pain or pressure, SOB, DOE, PND, orthopnea, LE edema, dizziness, palpitations or syncope. She is compliant with her meds and is tolerating meds with no SE.    She is doing well with her PAP device and thinks that she has gotten used to it.  SHe tolerates the mask and feels the pressure is adequate.  Since going on PAP she feels rested in the am and has no significant daytime sleepiness.  She denies any significant mouth or nasal dryness or nasal congestion.  She does not think that he snores.   Prior CV studies:   The following studies were reviewed today:  PAP compliance download  Past Medical History:  Diagnosis Date   Acquired dilation of ascending aorta and aortic root (HCC)    borderline at 38mm by echo 07/2020   Aortic stenosis    mild with mean aortic valve gradient 12 mmHg by echo 08/2022   Celiac disease    Diastolic dysfunction    Hyperlipidemia    Hypertension    Hypothyroidism    OSA (obstructive sleep apnea)    mild with AHI 9.58/hr on CPAP at 7cm H2O   Past Surgical History:  Procedure Laterality Date   left knee sx     TRIGGER FINGER RELEASE Left 2017     Current Meds  Medication Sig   ascorbic acid (VITAMIN C) 1000 MG tablet Take 1,000 mg by mouth daily.   aspirin EC 81 MG tablet Take 81 mg by mouth at bedtime.   cholecalciferol (VITAMIN D) 1000 UNITS tablet Take 1,000 Units by mouth daily.   diclofenac Sodium (VOLTAREN) 1 % GEL Apply 4 g topically 4 (four) times daily as needed (pain).   famotidine (PEPCID) 20 MG tablet Take 20 mg by mouth daily.   ibuprofen  (ADVIL) 600 MG tablet Take 1 tablet (600 mg total) by mouth every 6 (six) hours as needed.   levothyroxine (SYNTHROID) 112 MCG tablet Take 112 mcg by mouth every morning.   lisinopril-hydrochlorothiazide (PRINZIDE,ZESTORETIC) 10-12.5 MG per tablet Take 1 tablet by mouth daily.   loratadine (CLARITIN) 10 MG tablet Take 10 mg by mouth daily.   NON FORMULARY CPAP MACHINE   Omega-3 Fatty Acids (FISH OIL) 1000 MG CAPS Take 1,000 mg by mouth daily.   pravastatin (PRAVACHOL) 40 MG tablet Take 40 mg by mouth daily.   sodium chloride (OCEAN) 0.65 % SOLN nasal spray Place 1 spray into both nostrils as needed for congestion.   Verapamil HCl CR 300 MG CP24 Take 300 mg by mouth daily.      Allergies:   Wheat, Celebrex [celecoxib], and Gluten meal   Social History   Tobacco Use   Smoking status: Never   Smokeless tobacco: Never  Vaping Use   Vaping status: Never Used  Substance Use Topics   Alcohol use: No    Alcohol/week: 0.0 standard drinks of alcohol   Drug use: No     Family Hx: The patient's family history includes AAA (abdominal aortic aneurysm)  in her brother; Heart attack in her father and mother. There is no history of Breast cancer.  ROS:   Please see the history of present illness.     All other systems reviewed and are negative.   Labs/Other Tests and Data Reviewed:    Recent Labs: 11/17/2022: BUN 16; Creatinine, Ser 0.94; Potassium 4.9; Sodium 140   Recent Lipid Panel No results found for: "CHOL", "TRIG", "HDL", "CHOLHDL", "LDLCALC", "LDLDIRECT"  Wt Readings from Last 3 Encounters:  11/17/22 244 lb 6.4 oz (110.9 kg)  04/22/22 240 lb (108.9 kg)  09/07/21 245 lb 9.6 oz (111.4 kg)    EKG was not performed today   Objective:    Vital Signs:  There were no vitals taken for this visit.   GEN: Well nourished, well developed in no acute distress HEENT: Normal NECK: No JVD; No carotid bruits LYMPHATICS: No lymphadenopathy CARDIAC:RRR, no  rubs, gallops.  2/6 SM at RUSB  to LUSB RESPIRATORY:  Clear to auscultation without rales, wheezing or rhonchi  ABDOMEN: Soft, non-tender, non-distended MUSCULOSKELETAL:  No edema; No deformity  SKIN: Warm and dry NEUROLOGIC:  Alert and oriented x 3 PSYCHIATRIC:  Normal affect  ASSESSMENT & PLAN:    1. OSA - The patient is tolerating PAP therapy well without any problems. The PAP download performed by his DME was personally reviewed and interpreted by me today and showed an AHI of 0.3 /hr on 7 cm H2O with 100% compliance in using more than 4 hours nightly.  The patient has been using and benefiting from PAP use and will continue to benefit from therapy.    2. HTN -BP is adequately controlled on exam today -Continue prescription drug management with lisinopril HCT 10-12.5 mg daily and verapamil CR 300 mg daily with as needed refills -I have personally reviewed and interpreted outside labs performed by patient's PCP which showed serum creatinine 1.02 and potassium 4.6 on 02/02/2023  3.  Aortic stenosis -2D echo 08/05/2020 showed mild aortic stenosis with mean aortic valve gradient 10 mmHg -Repeat 2D echo 08/23/2022 showed EF 60 to 65% with mild LVH, grade 1 diastolic dysfunction, mild MR and mild aortic valve stenosis with mean gradient 12.6 mmHg, V-max 2.34 m/s and AVA by VTI 1.56 cm -Repeat 2D echo in 1 year  Medication Adjustments/Labs and Tests Ordered: Current medicines are reviewed at length with the patient today.  Concerns regarding medicines are outlined above.  Tests Ordered: No orders of the defined types were placed in this encounter.  Medication Changes: No orders of the defined types were placed in this encounter.   Disposition:  Follow up in 1 year(s)  Signed, Armanda Magic, MD  02/09/2023 10:08 AM    [facility]

## 2023-02-09 NOTE — Telephone Encounter (Signed)
Order placed to [facility] via community message 

## 2023-02-09 NOTE — Patient Instructions (Signed)
Medication Instructions:  Your physician recommends that you continue on your current medications as directed. Please refer to the Current Medication list given to you today. *If you need a refill on your cardiac medications before your next appointment, please call your pharmacy*   Lab Work: None.  If you have labs (blood work) drawn today and your tests are completely normal, you will receive your results only by: MyChart Message (if you have MyChart) OR A paper copy in the mail If you have any lab test that is abnormal or we need to change your treatment, we will call you to review the results.   Testing/Procedures: None.   Follow-Up: At [facility], you and your health needs are our priority.  As part of our continuing mission to provide you with exceptional heart care, we have created designated Provider Care Teams.  These Care Teams include your primary Cardiologist (physician) and Advanced Practice Providers (APPs -  Physician Assistants and Nurse Practitioners) who all work together to provide you with the care you need, when you need it.  We recommend signing up for the patient portal called "MyChart".  Sign up information is provided on this After Visit Summary.  MyChart is used to connect with patients for Virtual Visits (Telemedicine).  Patients are able to view lab/test results, encounter notes, upcoming appointments, etc.  Non-urgent messages can be sent to your provider as well.   To learn more about what you can do with MyChart, go to [email or web address].    Your next appointment:   1 year(s)  Provider:   Armanda Magic, MD     Other Instructions Dr. Mayford Knife has ordered new cpap supplies for you.

## 2023-02-21 ENCOUNTER — Ambulatory Visit: Payer: Medicare Other | Admitting: Cardiology

## 2023-03-16 ENCOUNTER — Telehealth: Payer: Self-pay | Admitting: Cardiology

## 2023-03-16 ENCOUNTER — Other Ambulatory Visit: Payer: Self-pay | Admitting: Orthopedic Surgery

## 2023-03-16 NOTE — Telephone Encounter (Signed)
   Patient Name: Tina Elliott  DOB: 1945-07-29 MRN: [id number]  Primary Cardiologist: Armanda Magic, MD  Chart reviewed as part of pre-operative protocol coverage. Given past medical history and time since last visit, based on ACC/AHA guidelines, Tina Elliott is at acceptable risk for the planned procedure without further cardiovascular testing. Pt was last seen by Dr. Mayford Knife on 02/09/2023 and was doing well. Per Dr. Mayford Knife, "She is fine to have surgery."   Regarding ASA therapy, we recommend continuation of ASA throughout the perioperative period.  However, if the surgeon feels that cessation of ASA is required in the perioperative period, it may be stopped 5-7 days prior to surgery with a plan to resume it as soon as felt to be feasible from a surgical standpoint in the post-operative period.  I will route this recommendation to the requesting party via Epic fax function and remove from pre-op pool.  Please call with questions.  Joylene Grapes, NP 03/16/2023, 4:07 PM

## 2023-03-16 NOTE — Telephone Encounter (Signed)
   Pre-operative Risk Assessment    Patient Name: Tina Elliott  DOB: [birth year] MRN: [id number]      Request for Surgical Clearance    Procedure:   Left carpal tunnel release  Date of Surgery:  Clearance 04/10/23                                 Surgeon:  Dr. Betha Loa Surgeon's Group or Practice Name:  [facility]  Phone number:  [phone]  Fax number:  [phone]   Type of Clearance Requested:   - Medical  - Pharmacy:  Hold Aspirin     Type of Anesthesia:   Choice   Additional requests/questions:   Caller Steward Drone) stated they will need to know how long to hold patient's Aspirin.    Signed, Annetta Maw   03/16/2023, 11:15 AM

## 2023-03-31 ENCOUNTER — Other Ambulatory Visit: Payer: Self-pay

## 2023-03-31 ENCOUNTER — Encounter (HOSPITAL_BASED_OUTPATIENT_CLINIC_OR_DEPARTMENT_OTHER): Payer: Self-pay | Admitting: Orthopedic Surgery

## 2023-04-03 ENCOUNTER — Encounter (HOSPITAL_BASED_OUTPATIENT_CLINIC_OR_DEPARTMENT_OTHER)
Admission: RE | Admit: 2023-04-03 | Discharge: 2023-04-03 | Disposition: A | Discharge status: Home / Self Care | Payer: Medicare Other | Source: Ambulatory Visit | Attending: Orthopedic Surgery

## 2023-04-03 DIAGNOSIS — I10 Essential (primary) hypertension: Secondary | ICD-10-CM | POA: Insufficient documentation

## 2023-04-03 DIAGNOSIS — Z01810 Encounter for preprocedural cardiovascular examination: Secondary | ICD-10-CM | POA: Diagnosis present

## 2023-04-03 LAB — BASIC METABOLIC PANEL
Anion gap: 9 (ref 5–15)
BUN: 14 mg/dL (ref 8–23)
CO2: 24 mmol/L (ref 22–32)
Calcium: 9.4 mg/dL (ref 8.9–10.3)
Chloride: 105 mmol/L (ref 98–111)
Creatinine, Ser: 1 mg/dL (ref 0.44–1.00)
GFR, Estimated: 57 mL/min — ABNORMAL LOW (ref >60)
Glucose, Bld: 125 mg/dL — ABNORMAL HIGH (ref 70–99)
Potassium: 3.8 mmol/L (ref 3.5–5.1)
Sodium: 138 mmol/L (ref 135–145)

## 2023-04-03 NOTE — Progress Notes (Signed)

## 2023-04-10 ENCOUNTER — Other Ambulatory Visit: Payer: Self-pay

## 2023-04-10 ENCOUNTER — Encounter (HOSPITAL_BASED_OUTPATIENT_CLINIC_OR_DEPARTMENT_OTHER)
Admission: RE | Disposition: A | Discharge status: Home / Self Care | Payer: Self-pay | Source: Ambulatory Visit | Attending: Orthopedic Surgery

## 2023-04-10 ENCOUNTER — Ambulatory Visit (HOSPITAL_BASED_OUTPATIENT_CLINIC_OR_DEPARTMENT_OTHER): Payer: Medicare Other | Admitting: Anesthesiology

## 2023-04-10 ENCOUNTER — Ambulatory Visit (HOSPITAL_BASED_OUTPATIENT_CLINIC_OR_DEPARTMENT_OTHER)
Admission: RE | Admit: 2023-04-10 | Discharge: 2023-04-10 | Disposition: A | Discharge status: Home / Self Care | Payer: Medicare Other | Source: Ambulatory Visit | Attending: Orthopedic Surgery | Admitting: Orthopedic Surgery

## 2023-04-10 ENCOUNTER — Encounter (HOSPITAL_BASED_OUTPATIENT_CLINIC_OR_DEPARTMENT_OTHER): Payer: Self-pay | Admitting: Orthopedic Surgery

## 2023-04-10 DIAGNOSIS — I10 Essential (primary) hypertension: Secondary | ICD-10-CM | POA: Diagnosis not present

## 2023-04-10 DIAGNOSIS — G5602 Carpal tunnel syndrome, left upper limb: Secondary | ICD-10-CM

## 2023-04-10 DIAGNOSIS — G4733 Obstructive sleep apnea (adult) (pediatric): Secondary | ICD-10-CM | POA: Diagnosis not present

## 2023-04-10 DIAGNOSIS — Z79899 Other long term (current) drug therapy: Secondary | ICD-10-CM | POA: Insufficient documentation

## 2023-04-10 DIAGNOSIS — E039 Hypothyroidism, unspecified: Secondary | ICD-10-CM | POA: Diagnosis not present

## 2023-04-10 HISTORY — PX: CARPAL TUNNEL RELEASE: SHX101

## 2023-04-10 SURGERY — CARPAL TUNNEL RELEASE
Anesthesia: General | Site: Wrist | Laterality: Left

## 2023-04-10 MED ORDER — MIDAZOLAM HCL 2 MG/2ML IJ SOLN
INTRAMUSCULAR | Status: AC
  Filled 2023-04-10: qty 2

## 2023-04-10 MED ORDER — FENTANYL CITRATE (PF) 100 MCG/2ML IJ SOLN
INTRAMUSCULAR | Status: AC
  Filled 2023-04-10: qty 2

## 2023-04-10 MED ORDER — FENTANYL CITRATE (PF) 100 MCG/2ML IJ SOLN
INTRAMUSCULAR | Status: DC | PRN
  Administered 2023-04-10 (×2): 25 ug via INTRAVENOUS
  Administered 2023-04-10: 50 ug via INTRAVENOUS

## 2023-04-10 MED ORDER — HYDROCODONE-ACETAMINOPHEN 5-325 MG PO TABS
ORAL_TABLET | ORAL | 0 refills | Status: DC
Start: 2023-04-10 — End: 2023-09-05

## 2023-04-10 MED ORDER — ONDANSETRON HCL 4 MG/2ML IJ SOLN
INTRAMUSCULAR | Status: DC | PRN
  Administered 2023-04-10: 4 mg via INTRAVENOUS

## 2023-04-10 MED ORDER — 0.9 % SODIUM CHLORIDE (POUR BTL) OPTIME
TOPICAL | Status: DC | PRN
Start: 2023-04-10 — End: 2023-04-10
  Administered 2023-04-10: 120 mL

## 2023-04-10 MED ORDER — PROPOFOL 10 MG/ML IV BOLUS
INTRAVENOUS | Status: AC
  Filled 2023-04-10: qty 20

## 2023-04-10 MED ORDER — DEXAMETHASONE SODIUM PHOSPHATE 10 MG/ML IJ SOLN
INTRAMUSCULAR | Status: DC | PRN
Start: 2023-04-10 — End: 2023-04-10
  Administered 2023-04-10: 10 mg via INTRAVENOUS

## 2023-04-10 MED ORDER — CEFAZOLIN SODIUM-DEXTROSE 2-4 GM/100ML-% IV SOLN
INTRAVENOUS | Status: AC
  Filled 2023-04-10: qty 100

## 2023-04-10 MED ORDER — BUPIVACAINE HCL (PF) 0.25 % IJ SOLN
INTRAMUSCULAR | Status: DC | PRN
  Administered 2023-04-10: 9 mL

## 2023-04-10 MED ORDER — LIDOCAINE HCL (CARDIAC) PF 100 MG/5ML IV SOSY
PREFILLED_SYRINGE | INTRAVENOUS | Status: DC | PRN
  Administered 2023-04-10: 60 mg via INTRAVENOUS

## 2023-04-10 MED ORDER — ONDANSETRON HCL 4 MG/2ML IJ SOLN
INTRAMUSCULAR | Status: AC
  Filled 2023-04-10: qty 2

## 2023-04-10 MED ORDER — LACTATED RINGERS IV SOLN: INTRAVENOUS | Status: DC

## 2023-04-10 MED ORDER — CEFAZOLIN SODIUM-DEXTROSE 2-4 GM/100ML-% IV SOLN
2.0000 g | INTRAVENOUS | Status: AC
  Administered 2023-04-10: 2 g via INTRAVENOUS

## 2023-04-10 MED ORDER — MIDAZOLAM HCL 5 MG/5ML IJ SOLN
INTRAMUSCULAR | Status: DC | PRN
  Administered 2023-04-10: 2 mg via INTRAVENOUS

## 2023-04-10 MED ORDER — PROPOFOL 10 MG/ML IV BOLUS
INTRAVENOUS | Status: DC | PRN
  Administered 2023-04-10: 50 mg via INTRAVENOUS
  Administered 2023-04-10: 150 mg via INTRAVENOUS

## 2023-04-10 SURGICAL SUPPLY — 43 items
APL PRP STRL LF DISP 70% ISPRP (MISCELLANEOUS) ×1
BLADE SURG 15 STRL LF DISP TIS (BLADE) ×2 IMPLANT
BLADE SURG 15 STRL SS (BLADE) ×2
BNDG CMPR 5X3 KNIT ELC UNQ LF (GAUZE/BANDAGES/DRESSINGS) ×1
BNDG CMPR 9X4 STRL LF SNTH (GAUZE/BANDAGES/DRESSINGS) ×1
BNDG ELASTIC 3INX 5YD STR LF (GAUZE/BANDAGES/DRESSINGS) ×1 IMPLANT
BNDG ESMARK 4X9 LF (GAUZE/BANDAGES/DRESSINGS) IMPLANT
BNDG GAUZE DERMACEA FLUFF 4 (GAUZE/BANDAGES/DRESSINGS) ×1 IMPLANT
BNDG GZE DERMACEA 4 6PLY (GAUZE/BANDAGES/DRESSINGS) ×1
CHLORAPREP W/TINT 26 (MISCELLANEOUS) ×1 IMPLANT
CORD BIPOLAR FORCEPS 12FT (ELECTRODE) ×1 IMPLANT
COVER BACK TABLE 60X90IN (DRAPES) ×1 IMPLANT
COVER MAYO STAND STRL (DRAPES) ×1 IMPLANT
CUFF TOURN SGL QUICK 18X4 (TOURNIQUET CUFF) ×1 IMPLANT
CUFF TOURN SGL QUICK 24 (TOURNIQUET CUFF) ×1
CUFF TRNQT CYL 24X4X16.5-23 (TOURNIQUET CUFF) IMPLANT
DRAPE EXTREMITY T 121X128X90 (DISPOSABLE) ×1 IMPLANT
DRAPE SURG 17X23 STRL (DRAPES) ×1 IMPLANT
GAUZE PAD ABD 8X10 STRL (GAUZE/BANDAGES/DRESSINGS) ×1 IMPLANT
GAUZE SPONGE 4X4 12PLY STRL (GAUZE/BANDAGES/DRESSINGS) ×1 IMPLANT
GAUZE XEROFORM 1X8 LF (GAUZE/BANDAGES/DRESSINGS) ×1 IMPLANT
GLOVE BIO SURGEON STRL SZ7.5 (GLOVE) ×1 IMPLANT
GLOVE BIOGEL PI IND STRL 7.0 (GLOVE) IMPLANT
GLOVE BIOGEL PI IND STRL 7.5 (GLOVE) IMPLANT
GLOVE BIOGEL PI IND STRL 8 (GLOVE) ×1 IMPLANT
GLOVE SURG SS PI 7.0 STRL IVOR (GLOVE) IMPLANT
GLOVE SURG SS PI 7.5 STRL IVOR (GLOVE) IMPLANT
GOWN STRL REUS W/ TWL LRG LVL3 (GOWN DISPOSABLE) ×1 IMPLANT
GOWN STRL REUS W/ TWL XL LVL3 (GOWN DISPOSABLE) IMPLANT
GOWN STRL REUS W/TWL LRG LVL3 (GOWN DISPOSABLE) ×1
GOWN STRL REUS W/TWL XL LVL3 (GOWN DISPOSABLE) ×2 IMPLANT
NDL HYPO 25X1 1.5 SAFETY (NEEDLE) ×1 IMPLANT
NEEDLE HYPO 25X1 1.5 SAFETY (NEEDLE) ×1
NS IRRIG 1000ML POUR BTL (IV SOLUTION) ×1 IMPLANT
PACK BASIN DAY SURGERY FS (CUSTOM PROCEDURE TRAY) ×1 IMPLANT
PADDING CAST ABS COTTON 4X4 ST (CAST SUPPLIES) ×1 IMPLANT
SLEEVE SCD COMPRESS KNEE MED (STOCKING) IMPLANT
STOCKINETTE 4X48 STRL (DRAPES) ×1 IMPLANT
SUT ETHILON 4 0 PS 2 18 (SUTURE) ×1 IMPLANT
SYR BULB EAR ULCER 3OZ GRN STR (SYRINGE) ×1 IMPLANT
SYR CONTROL 10ML LL (SYRINGE) ×1 IMPLANT
TOWEL GREEN STERILE FF (TOWEL DISPOSABLE) ×2 IMPLANT
UNDERPAD 30X36 HEAVY ABSORB (UNDERPADS AND DIAPERS) ×1 IMPLANT

## 2023-04-10 NOTE — Discharge Instructions (Addendum)

## 2023-04-10 NOTE — Op Note (Signed)
04/10/2023 [facility]                              OPERATIVE REPORT   PREOPERATIVE DIAGNOSIS:  Left carpal tunnel syndrome.  POSTOPERATIVE DIAGNOSIS:  Left carpal tunnel syndrome.  PROCEDURE:  Left carpal tunnel release.  SURGEON:  Betha Loa, MD  ASSISTANT:  none.  ANESTHESIA: General  IV FLUIDS:  Per anesthesia flow sheet.  ESTIMATED BLOOD LOSS:  Minimal.  COMPLICATIONS:  None.  SPECIMENS:  None.  TOURNIQUET TIME:    Total Tourniquet Time Documented: Upper Arm (Left) - 11 minutes Total: Upper Arm (Left) - 11 minutes   DISPOSITION:  Stable to PACU.  LOCATION: [facility]  INDICATIONS:  77 y.o. yo female with numbness and tingling left hand.  Nocturnal symptoms. Positive nerve conduction studies. She wishes to proceed with left carpal tunnel release.  Risks, benefits and alternatives of surgery were discussed including the risk of blood loss; infection; damage to nerves, vessels, tendons, ligaments, bone; failure of surgery; need for additional surgery; complications with wound healing; continued pain; recurrence of carpal tunnel syndrome; and damage to motor branch. She voiced understanding of these risks and elected to proceed.   OPERATIVE COURSE:  After being identified preoperatively by myself, the patient and I agreed upon the procedure and site of procedure.  The surgical site was marked.  Surgical consent had been signed.  She was given IV Ancef as preoperative antibiotic prophylaxis.  She was transferred to the operating room and placed on the operating room table in supine position with the Left upper extremity on an armboard.  General anesthesia was induced by the anesthesiologist.  Left upper extremity was prepped and draped in normal sterile orthopaedic fashion.  A surgical pause was performed between the surgeons, anesthesia, and operating room staff, and all were in agreement as to the patient, procedure, and site of procedure.   Tourniquet at the proximal aspect of the extremity was inflated to 250 mmHg after exsanguination of the arm with an Esmarch bandage  Incision was made over the transverse carpal ligament and carried into the subcutaneous tissues by spreading technique.  Bipolar electrocautery was used to obtain hemostasis.  The palmar fascia was sharply incised.  The transverse carpal ligament was identified.  The fascia distal to the ligament was opened.  Retractor was placed and the flexor tendons were identified.  The flexor tendon to the ring finger was identified and retracted radially.  The transverse carpal ligament was then incised from distal to proximal under direct visualization.  Scissors were used to split the distal aspect of the volar antebrachial fascia.  A finger was placed into the wound to ensure complete decompression, which was the case.  The nerve was examined.  There was an hourglass deformity.  The motor branch was identified and was intact.  The wound was copiously irrigated with sterile saline.  It was then closed with 4-0 nylon in a horizontal mattress fashion.  It was injected with 0.25% plain Marcaine to aid in postoperative analgesia.  It was dressed with sterile Xeroform, 4x4s, an ABD, and wrapped with Kerlix and an Ace bandage.  Tourniquet was deflated at 11 minutes.  Fingertips were pink with brisk capillary refill after deflation of the tourniquet.  Operative drapes were broken down.  The patient was awoken from anesthesia safely.  She was transferred back to stretcher and taken to the PACU in stable condition.  I will see her back in the office in 1 week for postoperative followup.  I will give her a prescription for Norco 5/325 1-2 tabs PO q6 hours prn pain, dispense # 15.    Betha Loa, MD Electronically signed, 04/10/23

## 2023-04-10 NOTE — Anesthesia Preprocedure Evaluation (Addendum)
Anesthesia Evaluation  Patient identified by MRN, date of birth, ID band Patient awake    Reviewed: Allergy & Precautions, NPO status , Patient's Chart, lab work & pertinent test results  Airway Mallampati: II  TM Distance: <3 FB Neck ROM: Full    Dental  (+) Teeth Intact, Dental Advisory Given, Chipped,    Pulmonary sleep apnea    Pulmonary exam normal breath sounds clear to auscultation       Cardiovascular hypertension, Pt. on medications + Valvular Problems/Murmurs AS  Rhythm:Regular Rate:Normal + Systolic murmurs Echo 06/08/18 Study Conclusions  - Left ventricle: The cavity size was mildly dilated. Systolicfunction was normal. The estimated ejection fraction was in therange of 55% to 60%. Wall motion was normal; there were noregional wall motion abnormalities. Doppler parameters areconsistent with abnormal left ventricular relaxation (grade 1diastolic dysfunction). - Aortic valve: Moderately calcified annulus. Trileaflet;moderately thickened, moderately calcified leaflets. Valvemobility was restricted. Peak velocity (S): 216 cm/s. Meangradient (S): 9 mm Hg. Valve area (Vmax): 1.97 cm^2. - Mitral valve: Moderately calcified annulus. There was mildregurgitation.   Neuro/Psych negative neurological ROS  negative psych ROS   GI/Hepatic ,GERD  Medicated,,Biliary colic    Endo/Other  Hypothyroidism  Obesity   Renal/GU negative Renal ROS     Musculoskeletal negative musculoskeletal ROS (+)    Abdominal  (+) + obese  Peds  Hematology negative hematology ROS (+)   Anesthesia Other Findings   Reproductive/Obstetrics negative OB ROS                             Anesthesia Physical Anesthesia Plan  ASA: II  Anesthesia Plan: General   Post-op Pain Management:    Induction: Intravenous  PONV Risk Score and Plan: 3 and Dexamethasone, Ondansetron, Treatment may vary due to age or  medical condition and Midazolam  Airway Management Planned: LMA  Additional Equipment:   Intra-op Plan:   Post-operative Plan: Extubation in OR  Informed Consent: I have reviewed the patients History and Physical, chart, labs and discussed the procedure including the risks, benefits and alternatives for the proposed anesthesia with the patient or authorized representative who has indicated his/her understanding and acceptance.     Dental advisory given  Plan Discussed with: CRNA  Anesthesia Plan Comments:         Anesthesia Quick Evaluation

## 2023-04-10 NOTE — Anesthesia Postprocedure Evaluation (Signed)
Anesthesia Post Note  Patient: Tina Elliott  Procedure(s) Performed: LEFT CARPAL TUNNEL RELEASE (Left: Wrist)     Patient location during evaluation: PACU Anesthesia Type: General Level of consciousness: awake and alert Pain management: pain level controlled Vital Signs Assessment: post-procedure vital signs reviewed and stable Respiratory status: spontaneous breathing, nonlabored ventilation and respiratory function stable Cardiovascular status: blood pressure returned to baseline and stable Postop Assessment: no apparent nausea or vomiting Anesthetic complications: no   No notable events documented.  Last Vitals:  Vitals:   04/10/23 1515 04/10/23 1531  BP: (!) 156/70 (!) 151/81  Pulse: 70 74  Resp: 13 16  Temp:  36.6 C  SpO2: 96% 95%    Last Pain:  Vitals:   04/10/23 1531  TempSrc:   PainSc: 0-No pain                 Lowella Curb

## 2023-04-10 NOTE — H&P (Signed)
Tina Elliott is an 77 y.o. female.   Chief Complaint: carpal tunnel syndrome HPI: 77 y.o. yo female with numbness and tingling left hand.  Nocturnal symptoms. Positive nerve conduction studies. She wishes to have left carpal tunnel release.   Allergies:  Allergies  Allergen Reactions   Wheat Anaphylaxis   Celebrex [Celecoxib] Other (See Comments)    GI issues   Gluten Meal Other (See Comments)    Celiac disease    Past Medical History:  Diagnosis Date   Acquired dilation of ascending aorta and aortic root (HCC)    borderline at 38mm by echo 07/2020   Aortic stenosis    mild with mean aortic valve gradient 12 mmHg by echo 08/2022   Celiac disease    Diastolic dysfunction    Hyperlipidemia    Hypertension    Hypothyroidism    OSA (obstructive sleep apnea)    mild with AHI 9.58/hr on CPAP at 7cm H2O    Past Surgical History:  Procedure Laterality Date   left knee sx     TRIGGER FINGER RELEASE Left 2017    Family History: Family History  Problem Relation Age of Onset   Heart attack Mother    Heart attack Father    AAA (abdominal aortic aneurysm) Brother    Breast cancer Neg Hx     Social History:   reports that she has never smoked. She has never used smokeless tobacco. She reports that she does not drink alcohol and does not use drugs.  Medications: Medications Prior to Admission  Medication Sig Dispense Refill   ascorbic acid (VITAMIN C) 1000 MG tablet Take 1,000 mg by mouth daily.     aspirin EC 81 MG tablet Take 81 mg by mouth at bedtime.     cholecalciferol (VITAMIN D) 1000 UNITS tablet Take 1,000 Units by mouth daily.     famotidine (PEPCID) 20 MG tablet Take 20 mg by mouth daily.     ibuprofen (ADVIL) 600 MG tablet Take 1 tablet (600 mg total) by mouth every 6 (six) hours as needed. 30 tablet 0   levothyroxine (SYNTHROID) 112 MCG tablet Take 112 mcg by mouth every morning.     lisinopril-hydrochlorothiazide (PRINZIDE,ZESTORETIC) 10-12.5 MG per tablet  Take 1 tablet by mouth daily.     loratadine (CLARITIN) 10 MG tablet Take 10 mg by mouth daily.     Omega-3 Fatty Acids (FISH OIL) 1000 MG CAPS Take 1,000 mg by mouth daily.     pravastatin (PRAVACHOL) 40 MG tablet Take 40 mg by mouth daily.     Verapamil HCl CR 300 MG CP24 Take 300 mg by mouth daily.      ciclopirox (PENLAC) 8 % solution Apply topically at bedtime. Apply over nail and surrounding skin. Apply daily over previous coat. After seven (7) days, may remove with alcohol and continue cycle. (Patient not taking: Reported on 02/09/2023) 6.6 mL 0   diclofenac Sodium (VOLTAREN) 1 % GEL Apply 4 g topically 4 (four) times daily as needed (pain). 4 g 0   hydrocortisone cream 1 % Apply 1 application topically 2 (two) times daily as needed (hemorrhoids). (Patient not taking: Reported on 02/09/2023)     NON FORMULARY CPAP MACHINE     sodium chloride (OCEAN) 0.65 % SOLN nasal spray Place 1 spray into both nostrils as needed for congestion.      No results found for this or any previous visit (from the past 48 hour(s)).  No results found.  Blood pressure (!) 172/86, pulse 88, temperature 98.1 F (36.7 C), temperature source Oral, resp. rate 16, height 5' 5.5" (1.664 m), weight 107.5 kg, SpO2 98%.  General appearance: alert, cooperative, and appears stated age Head: Normocephalic, without obvious abnormality, atraumatic Neck: supple, symmetrical, trachea midline Extremities: Intact capillary refill all digits.  +epl/fpl/io.  No wounds.  Pulses: 2+ and symmetric Skin: Skin color, texture, turgor normal. No rashes or lesions Neurologic: Grossly normal Incision/Wound: none  Assessment/Plan Left carpal tunnel syndrome.  Non operative and operative treatment options have been discussed with the patient and patient wishes to proceed with operative treatment. Risks, benefits and alternatives of surgery were discussed including risks of blood loss, infection, damage to  nerves/vessels/tendons/ligament/bone, failure of surgery, need for additional surgery, complication with wound healing, stiffness, recurrence.  She voiced understanding of these risks and elected to proceed.    Betha Loa 04/10/2023, 12:22 PM

## 2023-04-10 NOTE — Anesthesia Procedure Notes (Signed)
Procedure Name: LMA Insertion Date/Time: 04/10/2023 2:31 PM  Performed by: Thornell Mule, CRNAPre-anesthesia Checklist: Patient identified, Emergency Drugs available, Suction available and Patient being monitored Patient Re-evaluated:Patient Re-evaluated prior to induction Oxygen Delivery Method: Circle system utilized Preoxygenation: Pre-oxygenation with 100% oxygen Induction Type: IV induction LMA: LMA inserted LMA Size: 4.0 Number of attempts: 1 Placement Confirmation: positive ETCO2 Tube secured with: Tape Dental Injury: Teeth and Oropharynx as per pre-operative assessment

## 2023-04-10 NOTE — Transfer of Care (Signed)
Immediate Anesthesia Transfer of Care Note  Patient: Tina Elliott  Procedure(s) Performed: LEFT CARPAL TUNNEL RELEASE (Left: Wrist)  Patient Location: PACU  Anesthesia Type:General  Level of Consciousness: drowsy, patient cooperative, and responds to stimulation  Airway & Oxygen Therapy: Patient Spontanous Breathing and Patient connected to face mask oxygen  Post-op Assessment: Report given to RN and Post -op Vital signs reviewed and stable  Post vital signs: Reviewed and stable  Last Vitals:  Vitals Value Taken Time  BP    Temp    Pulse 73 04/10/23 1502  Resp    SpO2 99 % 04/10/23 1502  Vitals shown include unfiled device data.  Last Pain:  Vitals:   04/10/23 1200  TempSrc: Oral  PainSc: 0-No pain      Patients Stated Pain Goal: 3 (04/10/23 1200)  Complications: No notable events documented.

## 2023-04-11 ENCOUNTER — Encounter (HOSPITAL_BASED_OUTPATIENT_CLINIC_OR_DEPARTMENT_OTHER): Payer: Self-pay | Admitting: Orthopedic Surgery

## 2023-07-26 ENCOUNTER — Other Ambulatory Visit: Payer: Self-pay | Admitting: Orthopedic Surgery

## 2023-07-27 ENCOUNTER — Telehealth: Payer: Self-pay | Admitting: Cardiology

## 2023-07-27 ENCOUNTER — Telehealth: Payer: Self-pay

## 2023-07-27 ENCOUNTER — Other Ambulatory Visit: Payer: Self-pay | Admitting: Orthopedic Surgery

## 2023-07-27 NOTE — Telephone Encounter (Signed)
   Name: Tina Elliott  DOB: 1946-04-23  MRN: [id number]  Primary Cardiologist: Armanda Magic, MD   Preoperative team, please contact this patient and set up a phone call appointment for further preoperative risk assessment. Please obtain consent and complete medication review. Thank you for your help.  I confirm that guidance regarding antiplatelet and oral anticoagulation therapy has been completed and, if necessary, noted below.  Regarding ASA therapy, we recommend continuation of ASA throughout the perioperative period. However, if the surgeon feels that cessation of ASA is required in the perioperative period, it may be stopped 5-7 days prior to surgery with a plan to resume it as soon as felt to be feasible from a surgical standpoint in the post-operative period.   I also confirmed the patient resides in the state of [place]. As per [facility] Medical Board telemedicine laws, the patient must reside in the state in which the provider is licensed.   Napoleon Form, Leodis Rains, NP 07/27/2023, 4:41 PM [facility]

## 2023-07-27 NOTE — Telephone Encounter (Signed)
Called and spoke to patient she has been scheduled and consent is done     Patient Consent for Virtual Visit         Tina Elliott has provided verbal consent on 07/27/2023 for a virtual visit (video or telephone).   CONSENT FOR VIRTUAL VISIT FOR:  Tina Elliott  By participating in this virtual visit I agree to the following:  I hereby voluntarily request, consent and authorize [facility] and its employed or contracted physicians, physician assistants, nurse practitioners or other licensed health care professionals (the Practitioner), to provide me with telemedicine health care services (the "Services") as deemed necessary by the treating Practitioner. I acknowledge and consent to receive the Services by the Practitioner via telemedicine. I understand that the telemedicine visit will involve communicating with the Practitioner through live audiovisual communication technology and the disclosure of certain medical information by electronic transmission. I acknowledge that I have been given the opportunity to request an in-person assessment or other available alternative prior to the telemedicine visit and am voluntarily participating in the telemedicine visit.  I understand that I have the right to withhold or withdraw my consent to the use of telemedicine in the course of my care at any time, without affecting my right to future care or treatment, and that the Practitioner or I may terminate the telemedicine visit at any time. I understand that I have the right to inspect all information obtained and/or recorded in the course of the telemedicine visit and may receive copies of available information for a reasonable fee.  I understand that some of the potential risks of receiving the Services via telemedicine include:  Delay or interruption in medical evaluation due to technological equipment failure or disruption; Information transmitted may not be sufficient (e.g. poor  resolution of images) to allow for appropriate medical decision making by the Practitioner; and/or  In rare instances, security protocols could fail, causing a breach of personal health information.  Furthermore, I acknowledge that it is my responsibility to provide information about my medical history, conditions and care that is complete and accurate to the best of my ability. I acknowledge that Practitioner's advice, recommendations, and/or decision may be based on factors not within their control, such as incomplete or inaccurate data provided by me or distortions of diagnostic images or specimens that may result from electronic transmissions. I understand that the practice of medicine is not an exact science and that Practitioner makes no warranties or guarantees regarding treatment outcomes. I acknowledge that a copy of this consent can be made available to me via my patient portal [facility] MyChart), or I can request a printed copy by calling the office of [facility].    I understand that my insurance will be billed for this visit.   I have read or had this consent read to me. I understand the contents of this consent, which adequately explains the benefits and risks of the Services being provided via telemedicine.  I have been provided ample opportunity to ask questions regarding this consent and the Services and have had my questions answered to my satisfaction. I give my informed consent for the services to be provided through the use of telemedicine in my medical care

## 2023-07-27 NOTE — Telephone Encounter (Signed)
Called and spoke to patient she has been scheduled

## 2023-07-27 NOTE — Telephone Encounter (Signed)
   Pre-operative Risk Assessment    Patient Name: Tina Elliott  DOB: Dec 12, 1945 MRN: [id number]      Request for Surgical Clearance    Procedure:   rt carpal tunnel release  Date of Surgery:  Clearance 09/11/23                                 Surgeon:  Betha Loa Surgeon's Group or Practice Name:  [facility] Phone number:  [phone] Fax number:  [phone]   Type of Clearance Requested:   - Medical    Type of Anesthesia:   choice   Additional requests/questions:    Signed, April L Harrington   07/27/2023, 4:10 PM

## 2023-08-10 ENCOUNTER — Encounter: Payer: Self-pay | Admitting: Cardiology

## 2023-08-10 ENCOUNTER — Ambulatory Visit (HOSPITAL_COMMUNITY): Payer: Medicare Other | Attending: Internal Medicine

## 2023-08-10 DIAGNOSIS — I350 Nonrheumatic aortic (valve) stenosis: Secondary | ICD-10-CM | POA: Insufficient documentation

## 2023-08-10 LAB — ECHOCARDIOGRAM COMPLETE
AR max vel: 1.82 cm2
AV Area VTI: 1.98 cm2
AV Area mean vel: 1.65 cm2
AV Mean grad: 11 mm[Hg]
AV Peak grad: 21.3 mm[Hg]
Ao pk vel: 2.31 m/s
Area-P 1/2: 4.57 cm2
S' Lateral: 3.4 cm

## 2023-08-14 ENCOUNTER — Telehealth: Payer: Self-pay

## 2023-08-14 NOTE — Telephone Encounter (Signed)
-----   Message from Gaylyn Keas sent at 08/10/2023  9:11 PM EST ----- Echo showed normal pumping function of heart muscle EF 60-65% with borderline elevated pressure in the vessels of the lungs called pulmonary HTN, calcified MV with trivial leakiness of the MV, moderately calcified AV with mild AS.Aaron Aas Echo remains unchanged from prior echo.

## 2023-08-14 NOTE — Telephone Encounter (Signed)
 Call to patient to discuss that echo showed normal pumping function of heart muscle EF 60-65% with borderline elevated pressure in the vessels of the lungs (pulmonary HTN), calcified MV with trivial leakiness of the MV, moderately calcified AV with mild AS. Patient verbalizes understanding that Echo remains unchanged from prior echo.

## 2023-09-01 ENCOUNTER — Other Ambulatory Visit: Payer: Self-pay | Admitting: Family Medicine

## 2023-09-01 DIAGNOSIS — Z1231 Encounter for screening mammogram for malignant neoplasm of breast: Secondary | ICD-10-CM

## 2023-09-05 ENCOUNTER — Encounter (HOSPITAL_BASED_OUTPATIENT_CLINIC_OR_DEPARTMENT_OTHER): Payer: Self-pay | Admitting: Orthopedic Surgery

## 2023-09-05 ENCOUNTER — Other Ambulatory Visit: Payer: Self-pay

## 2023-09-05 NOTE — Progress Notes (Signed)
 Chart reviewed with Dr Freida Busman, [place] for [facility].

## 2023-09-05 NOTE — Progress Notes (Signed)
   09/05/23 1102  PAT Phone Screen  Is the patient taking a GLP-1 receptor agonist? No  Do You Have Diabetes? No  Do You Have Hypertension? Yes  Have You Ever Been to the ER for Asthma? No  Have You Taken Oral Steroids in the Past 3 Months? No  Do you Take Phenteramine or any Other Diet Drugs? No  Recent  Lab Work, EKG, CXR? Yes  Where was this test performed? 04-03-23 EKG  Do you have a history of heart problems? (S)  Yes (hx non rheumatic aortic stenosis)  Cardiologist Name Dr Mayford Knife  Have you ever had tests on your heart? Yes  What cardiac tests were performed? Echo  What date/year were cardiac tests completed? 08-10-23 ECHO EF 60-65%, mild AS, aortic root normal  Results viewable: [facility] Media Tab  Any Recent Hospitalizations? No  Height 5' 5.5" (1.664 m)  Weight 112.9 kg  Pat Appointment Scheduled (S)  Yes (BMP)

## 2023-09-06 ENCOUNTER — Encounter (HOSPITAL_BASED_OUTPATIENT_CLINIC_OR_DEPARTMENT_OTHER)
Admission: RE | Admit: 2023-09-06 | Discharge: 2023-09-06 | Disposition: A | Discharge status: Home / Self Care | Source: Ambulatory Visit | Attending: Orthopedic Surgery | Admitting: Orthopedic Surgery

## 2023-09-06 ENCOUNTER — Ambulatory Visit: Payer: Medicare Other | Attending: Nurse Practitioner

## 2023-09-06 DIAGNOSIS — Z01810 Encounter for preprocedural cardiovascular examination: Secondary | ICD-10-CM

## 2023-09-06 DIAGNOSIS — I10 Essential (primary) hypertension: Secondary | ICD-10-CM | POA: Diagnosis not present

## 2023-09-06 DIAGNOSIS — Z01812 Encounter for preprocedural laboratory examination: Secondary | ICD-10-CM | POA: Insufficient documentation

## 2023-09-06 LAB — BASIC METABOLIC PANEL
Anion gap: 10 (ref 5–15)
BUN: 16 mg/dL (ref 8–23)
CO2: 26 mmol/L (ref 22–32)
Calcium: 9.8 mg/dL (ref 8.9–10.3)
Chloride: 102 mmol/L (ref 98–111)
Creatinine, Ser: 1.07 mg/dL — ABNORMAL HIGH (ref 0.44–1.00)
GFR, Estimated: 53 mL/min — ABNORMAL LOW (ref >60)
Glucose, Bld: 104 mg/dL — ABNORMAL HIGH (ref 70–99)
Potassium: 4 mmol/L (ref 3.5–5.1)
Sodium: 138 mmol/L (ref 135–145)

## 2023-09-06 NOTE — Progress Notes (Signed)
 Virtual Visit via Telephone Note   Because of Tina Elliott co-morbid illnesses, she is at least at moderate risk for complications without adequate follow up.  This format is felt to be most appropriate for this patient at this time.  Due to technical limitations with video connection (technology), today's appointment will be conducted as an audio only telehealth visit, and Tina Elliott verbally agreed to proceed in this manner.   All issues noted in this document were discussed and addressed.  No physical exam could be performed with this format.  Evaluation Performed:  Preoperative cardiovascular risk assessment _____________   Date:  09/06/2023   Patient ID:  Tina Elliott, DOB [birth year], MRN [id number] Patient Location:  Home Provider location:   Office  Primary Care Provider:  Orpha Bur, MD Primary Cardiologist:  Armanda Magic, MD  Chief Complaint / Patient Profile   78 y.o. y/o female with a h/o HTN, HLD, hypothyroidism, OSA (on CPAP), mild AS who is pending right carpal tunnel release and presents today for telephonic preoperative cardiovascular risk assessment.  History of Present Illness    Tina Elliott is a 78 y.o. female who presents via audio/video conferencing for a telehealth visit today.  Pt was last seen in cardiology clinic on 02/09/2023 by Dr. Mayford Knife. At that time Tina Elliott was doing well with no new cardiac complaints.  She was getting used to her new CPAP device and blood pressures were adequately controlled. The patient is now pending procedure as outlined above. Since her last visit, she has been doing well with no new cardiac complaints.  She is active and is able to complete greater than 4 METS of activity without difficulty.  She denies chest pain, shortness of breath, lower extremity edema, fatigue, palpitations, melena, hematuria, hemoptysis, diaphoresis, weakness, presyncope, syncope, orthopnea, and PND.   Past  Medical History    Past Medical History:  Diagnosis Date   Acquired dilation of ascending aorta and aortic root (HCC)    borderline at 38mm by echo 07/2020   Aortic stenosis    mild with mean aortic valve gradient by echo 08/2023   Arthritis    right knee, hands   Celiac disease    Diastolic dysfunction    GERD (gastroesophageal reflux disease)    Hyperlipidemia    Hypertension    Hypothyroidism    OSA (obstructive sleep apnea)    mild with AHI 9.58/hr on CPAP at 7cm H2O   Past Surgical History:  Procedure Laterality Date   CARPAL TUNNEL RELEASE Left 04/10/2023   Procedure: LEFT CARPAL TUNNEL RELEASE;  Surgeon: Betha Loa, MD;  Location: [facility];  Service: Orthopedics;  Laterality: Left;   left knee sx     TRIGGER FINGER RELEASE Left 2017    Allergies  Allergies  Allergen Reactions   Wheat Anaphylaxis   Celebrex [Celecoxib] Other (See Comments)    GI issues   Gluten Meal Other (See Comments)    Celiac disease    Home Medications    Prior to Admission medications   Medication Sig Start Date End Date Taking? Authorizing Provider  ascorbic acid (VITAMIN C) 1000 MG tablet Take 1,000 mg by mouth daily.    [provider]  aspirin EC 81 MG tablet Take 81 mg by mouth at bedtime.    [provider]  cholecalciferol (VITAMIN D) 1000 UNITS tablet Take 1,000 Units by mouth daily.    [provider]  diclofenac Sodium (  VOLTAREN) 1 % GEL Apply 4 g topically 4 (four) times daily as needed (pain). 04/23/22   Pollyann Savoy, MD  famotidine (PEPCID) 20 MG tablet Take 20 mg by mouth daily.    [provider]  ibuprofen (ADVIL) 600 MG tablet Take 1 tablet (600 mg total) by mouth every 6 (six) hours as needed. 04/23/22   Pollyann Savoy, MD  levothyroxine (SYNTHROID) 112 MCG tablet Take 112 mcg by mouth every morning.    [provider]  lisinopril-hydrochlorothiazide (PRINZIDE,ZESTORETIC) 10-12.5 MG per tablet  Take 1 tablet by mouth daily.    [provider]  loratadine (CLARITIN) 10 MG tablet Take 10 mg by mouth daily.    [provider]  NON FORMULARY CPAP MACHINE    [provider]  Omega-3 Fatty Acids (FISH OIL) 1000 MG CAPS Take 1,000 mg by mouth daily.    [provider]  pravastatin (PRAVACHOL) 40 MG tablet Take 40 mg by mouth daily.    [provider]  sodium chloride (OCEAN) 0.65 % SOLN nasal spray Place 1 spray into both nostrils as needed for congestion.    [provider]  Verapamil HCl CR 300 MG CP24 Take 300 mg by mouth daily.     [provider]    Physical Exam    Vital Signs:  Tina Elliott does not have vital signs available for review today.  Given telephonic nature of communication, physical exam is limited. AAOx3. NAD. Normal affect.  Speech and respirations are unlabored.  Accessory Clinical Findings    None  Assessment & Plan    1.  Preoperative Cardiovascular Risk Assessment: -Patient's RCRI score is 0.9%  The patient affirms she has been doing well without any new cardiac symptoms. They are able to achieve 6 METS without cardiac limitations. Therefore, based on ACC/AHA guidelines, the patient would be at acceptable risk for the planned procedure without further cardiovascular testing. The patient was advised that if she develops new symptoms prior to surgery to contact our office to arrange for a follow-up visit, and she verbalized understanding.   The patient was advised that if she develops new symptoms prior to surgery to contact our office to arrange for a follow-up visit, and she verbalized understanding.   Patient can hold ASA 81 mg 5 to 7 days prior to procedure and should restart postprocedure when surgically safe and hemostasis is achieved.  A copy of this note will be routed to requesting surgeon.  Time:   Today, I have spent 8 minutes with the patient with telehealth technology  discussing medical history, symptoms, and management plan.     Napoleon Form, Leodis Rains, NP  09/06/2023, 7:20 AM

## 2023-09-06 NOTE — Progress Notes (Signed)

## 2023-09-11 ENCOUNTER — Encounter (HOSPITAL_BASED_OUTPATIENT_CLINIC_OR_DEPARTMENT_OTHER)
Admission: RE | Disposition: A | Discharge status: Home / Self Care | Payer: Self-pay | Source: Ambulatory Visit | Attending: Orthopedic Surgery

## 2023-09-11 ENCOUNTER — Ambulatory Visit (HOSPITAL_BASED_OUTPATIENT_CLINIC_OR_DEPARTMENT_OTHER): Payer: Self-pay | Admitting: Anesthesiology

## 2023-09-11 ENCOUNTER — Other Ambulatory Visit: Payer: Self-pay

## 2023-09-11 ENCOUNTER — Ambulatory Visit (HOSPITAL_BASED_OUTPATIENT_CLINIC_OR_DEPARTMENT_OTHER)
Admission: RE | Admit: 2023-09-11 | Discharge: 2023-09-11 | Disposition: A | Discharge status: Home / Self Care | Payer: Medicare Other | Source: Ambulatory Visit | Attending: Orthopedic Surgery | Admitting: Orthopedic Surgery

## 2023-09-11 ENCOUNTER — Encounter (HOSPITAL_BASED_OUTPATIENT_CLINIC_OR_DEPARTMENT_OTHER): Payer: Self-pay | Admitting: Orthopedic Surgery

## 2023-09-11 DIAGNOSIS — I340 Nonrheumatic mitral (valve) insufficiency: Secondary | ICD-10-CM | POA: Insufficient documentation

## 2023-09-11 DIAGNOSIS — I10 Essential (primary) hypertension: Secondary | ICD-10-CM | POA: Diagnosis not present

## 2023-09-11 DIAGNOSIS — E785 Hyperlipidemia, unspecified: Secondary | ICD-10-CM | POA: Insufficient documentation

## 2023-09-11 DIAGNOSIS — K219 Gastro-esophageal reflux disease without esophagitis: Secondary | ICD-10-CM | POA: Diagnosis not present

## 2023-09-11 DIAGNOSIS — I350 Nonrheumatic aortic (valve) stenosis: Secondary | ICD-10-CM | POA: Diagnosis not present

## 2023-09-11 DIAGNOSIS — E039 Hypothyroidism, unspecified: Secondary | ICD-10-CM

## 2023-09-11 DIAGNOSIS — E669 Obesity, unspecified: Secondary | ICD-10-CM | POA: Diagnosis not present

## 2023-09-11 DIAGNOSIS — G4733 Obstructive sleep apnea (adult) (pediatric): Secondary | ICD-10-CM | POA: Diagnosis not present

## 2023-09-11 DIAGNOSIS — Z7982 Long term (current) use of aspirin: Secondary | ICD-10-CM | POA: Diagnosis not present

## 2023-09-11 DIAGNOSIS — G5601 Carpal tunnel syndrome, right upper limb: Secondary | ICD-10-CM | POA: Insufficient documentation

## 2023-09-11 DIAGNOSIS — Z79890 Hormone replacement therapy (postmenopausal): Secondary | ICD-10-CM | POA: Insufficient documentation

## 2023-09-11 DIAGNOSIS — Z79899 Other long term (current) drug therapy: Secondary | ICD-10-CM | POA: Diagnosis not present

## 2023-09-11 DIAGNOSIS — Z6836 Body mass index (BMI) 36.0-36.9, adult: Secondary | ICD-10-CM | POA: Insufficient documentation

## 2023-09-11 HISTORY — DX: Unspecified osteoarthritis, unspecified site: M19.90

## 2023-09-11 HISTORY — DX: Gastro-esophageal reflux disease without esophagitis: K21.9

## 2023-09-11 HISTORY — PX: CARPAL TUNNEL RELEASE: SHX101

## 2023-09-11 SURGERY — CARPAL TUNNEL RELEASE
Anesthesia: General | Site: Wrist | Laterality: Right

## 2023-09-11 MED ORDER — CEFAZOLIN SODIUM-DEXTROSE 2-4 GM/100ML-% IV SOLN
INTRAVENOUS | Status: AC
  Filled 2023-09-11: qty 100

## 2023-09-11 MED ORDER — LIDOCAINE 2% (20 MG/ML) 5 ML SYRINGE
INTRAMUSCULAR | Status: AC
  Filled 2023-09-11: qty 5

## 2023-09-11 MED ORDER — ONDANSETRON HCL 4 MG/2ML IJ SOLN: 4.0000 mg | Freq: Once | INTRAMUSCULAR | Status: DC | PRN

## 2023-09-11 MED ORDER — CEFAZOLIN SODIUM-DEXTROSE 2-4 GM/100ML-% IV SOLN
2.0000 g | INTRAVENOUS | Status: AC
  Administered 2023-09-11: 2 g via INTRAVENOUS

## 2023-09-11 MED ORDER — DEXAMETHASONE SODIUM PHOSPHATE 10 MG/ML IJ SOLN
INTRAMUSCULAR | Status: AC
  Filled 2023-09-11: qty 1

## 2023-09-11 MED ORDER — LIDOCAINE HCL (CARDIAC) PF 100 MG/5ML IV SOSY
PREFILLED_SYRINGE | INTRAVENOUS | Status: DC | PRN
  Administered 2023-09-11: 40 mg via INTRAVENOUS

## 2023-09-11 MED ORDER — ONDANSETRON HCL 4 MG/2ML IJ SOLN
INTRAMUSCULAR | Status: AC
Start: 2023-09-11 — End: ?
  Filled 2023-09-11: qty 2

## 2023-09-11 MED ORDER — FENTANYL CITRATE (PF) 100 MCG/2ML IJ SOLN: 25.0000 ug | INTRAMUSCULAR | Status: DC | PRN

## 2023-09-11 MED ORDER — PROPOFOL 10 MG/ML IV BOLUS
INTRAVENOUS | Status: DC | PRN
  Administered 2023-09-11: 200 mg via INTRAVENOUS

## 2023-09-11 MED ORDER — MIDAZOLAM HCL 5 MG/5ML IJ SOLN
INTRAMUSCULAR | Status: DC | PRN
  Administered 2023-09-11: 2 mg via INTRAVENOUS

## 2023-09-11 MED ORDER — BUPIVACAINE HCL (PF) 0.25 % IJ SOLN
INTRAMUSCULAR | Status: DC | PRN
  Administered 2023-09-11: 9 mL

## 2023-09-11 MED ORDER — FENTANYL CITRATE (PF) 100 MCG/2ML IJ SOLN
INTRAMUSCULAR | Status: AC
  Filled 2023-09-11: qty 2

## 2023-09-11 MED ORDER — PROPOFOL 10 MG/ML IV BOLUS
INTRAVENOUS | Status: AC
  Filled 2023-09-11: qty 20

## 2023-09-11 MED ORDER — ACETAMINOPHEN 325 MG PO TABS: 325.0000 mg | ORAL_TABLET | ORAL | Status: DC | PRN

## 2023-09-11 MED ORDER — ACETAMINOPHEN 160 MG/5ML PO SOLN: 325.0000 mg | ORAL | Status: DC | PRN

## 2023-09-11 MED ORDER — ONDANSETRON HCL 4 MG/2ML IJ SOLN
INTRAMUSCULAR | Status: DC | PRN
  Administered 2023-09-11: 4 mg via INTRAVENOUS

## 2023-09-11 MED ORDER — 0.9 % SODIUM CHLORIDE (POUR BTL) OPTIME
TOPICAL | Status: DC | PRN
  Administered 2023-09-11: 1000 mL

## 2023-09-11 MED ORDER — LACTATED RINGERS IV SOLN: INTRAVENOUS | Status: DC

## 2023-09-11 MED ORDER — OXYCODONE HCL 5 MG/5ML PO SOLN: 5.0000 mg | Freq: Once | ORAL | Status: DC | PRN

## 2023-09-11 MED ORDER — DEXAMETHASONE SODIUM PHOSPHATE 10 MG/ML IJ SOLN
INTRAMUSCULAR | Status: DC | PRN
  Administered 2023-09-11: 10 mg via INTRAVENOUS

## 2023-09-11 MED ORDER — ACETAMINOPHEN 500 MG PO TABS
ORAL_TABLET | ORAL | Status: AC
  Filled 2023-09-11: qty 2

## 2023-09-11 MED ORDER — ACETAMINOPHEN 500 MG PO TABS
1000.0000 mg | ORAL_TABLET | Freq: Once | ORAL | Status: AC
  Administered 2023-09-11: 1000 mg via ORAL

## 2023-09-11 MED ORDER — MIDAZOLAM HCL 2 MG/2ML IJ SOLN
INTRAMUSCULAR | Status: AC
  Filled 2023-09-11: qty 2

## 2023-09-11 MED ORDER — OXYCODONE HCL 5 MG PO TABS: 5.0000 mg | ORAL_TABLET | Freq: Once | ORAL | Status: DC | PRN

## 2023-09-11 MED ORDER — FENTANYL CITRATE (PF) 100 MCG/2ML IJ SOLN
INTRAMUSCULAR | Status: DC | PRN
Start: 2023-09-11 — End: 2023-09-11
  Administered 2023-09-11 (×2): 50 ug via INTRAVENOUS

## 2023-09-11 MED ORDER — MEPERIDINE HCL 25 MG/ML IJ SOLN: 6.2500 mg | INTRAMUSCULAR | Status: DC | PRN

## 2023-09-11 SURGICAL SUPPLY — 30 items
BLADE SURG 15 STRL LF DISP TIS (BLADE) ×2 IMPLANT
BNDG ELASTIC 3INX 5YD STR LF (GAUZE/BANDAGES/DRESSINGS) ×1 IMPLANT
BNDG ESMARK 4X9 LF (GAUZE/BANDAGES/DRESSINGS) IMPLANT
BNDG GAUZE DERMACEA FLUFF 4 (GAUZE/BANDAGES/DRESSINGS) ×1 IMPLANT
CHLORAPREP W/TINT 26 (MISCELLANEOUS) ×1 IMPLANT
CORD BIPOLAR FORCEPS 12FT (ELECTRODE) ×1 IMPLANT
COVER BACK TABLE 60X90IN (DRAPES) ×1 IMPLANT
COVER MAYO STAND STRL (DRAPES) ×1 IMPLANT
CUFF TOURN SGL QUICK 18X4 (TOURNIQUET CUFF) ×1 IMPLANT
DRAPE EXTREMITY T 121X128X90 (DISPOSABLE) ×1 IMPLANT
DRAPE SURG 17X23 STRL (DRAPES) ×1 IMPLANT
GAUZE PAD ABD 8X10 STRL (GAUZE/BANDAGES/DRESSINGS) ×1 IMPLANT
GAUZE SPONGE 4X4 12PLY STRL (GAUZE/BANDAGES/DRESSINGS) ×1 IMPLANT
GAUZE XEROFORM 1X8 LF (GAUZE/BANDAGES/DRESSINGS) ×1 IMPLANT
GLOVE BIO SURGEON STRL SZ7.5 (GLOVE) ×1 IMPLANT
GLOVE BIOGEL PI IND STRL 8 (GLOVE) ×1 IMPLANT
GOWN STRL REUS W/ TWL LRG LVL3 (GOWN DISPOSABLE) ×1 IMPLANT
GOWN STRL REUS W/TWL XL LVL3 (GOWN DISPOSABLE) ×1 IMPLANT
NDL HYPO 25X1 1.5 SAFETY (NEEDLE) ×1 IMPLANT
NEEDLE HYPO 25X1 1.5 SAFETY (NEEDLE) ×1 IMPLANT
NS IRRIG 1000ML POUR BTL (IV SOLUTION) ×1 IMPLANT
PACK BASIN DAY SURGERY FS (CUSTOM PROCEDURE TRAY) ×1 IMPLANT
PADDING CAST ABS COTTON 4X4 ST (CAST SUPPLIES) ×1 IMPLANT
STOCKINETTE 4X48 STRL (DRAPES) ×1 IMPLANT
SUT ETHILON 4 0 PS 2 18 (SUTURE) ×1 IMPLANT
SYR 10ML LL (SYRINGE) IMPLANT
SYR BULB EAR ULCER 3OZ GRN STR (SYRINGE) ×1 IMPLANT
SYR CONTROL 10ML LL (SYRINGE) ×1 IMPLANT
TOWEL GREEN STERILE FF (TOWEL DISPOSABLE) ×2 IMPLANT
UNDERPAD 30X36 HEAVY ABSORB (UNDERPADS AND DIAPERS) ×1 IMPLANT

## 2023-09-11 NOTE — Discharge Instructions (Addendum)
 No Tylenol until 5:15 today if needed  Post Anesthesia Home Care Instructions  Activity: Get plenty of rest for the remainder of the day. A responsible individual must stay with you for 24 hours following the procedure.  For the next 24 hours, DO NOT: -Drive a car -Advertising copywriter -Drink alcoholic beverages -Take any medication unless instructed by your physician -Make any legal decisions or sign important papers.  Meals: Start with liquid foods such as gelatin or soup. Progress to regular foods as tolerated. Avoid greasy, spicy, heavy foods. If nausea and/or vomiting occur, drink only clear liquids until the nausea and/or vomiting subsides. Call your physician if vomiting continues.  Special Instructions/Symptoms: Your throat may feel dry or sore from the anesthesia or the breathing tube placed in your throat during surgery. If this causes discomfort, gargle with warm salt water. The discomfort should disappear within 24 hours.  If you had a scopolamine patch placed behind your ear for the management of post- operative nausea and/or vomiting:  1. The medication in the patch is effective for 72 hours, after which it should be removed.  Wrap patch in a tissue and discard in the trash. Wash hands thoroughly with soap and water. 2. You may remove the patch earlier than 72 hours if you experience unpleasant side effects which may include dry mouth, dizziness or visual disturbances. 3. Avoid touching the patch. Wash your hands with soap and water after contact with the patch.      [facility] Instructions Hand Surgery  Wound Care: Keep your hand elevated above the level of your heart.  Do not allow it to dangle by your side.  Keep the dressing dry and do not remove it unless your doctor advises you to do so.  He will usually change it at the time of your post-op visit.  Moving your fingers is advised to stimulate circulation but will depend on the site of your surgery.  If you have a  splint applied, your doctor will advise you regarding movement.  Activity: Do not drive or operate machinery today.  Rest today and then you may return to your normal activity and work as indicated by your physician.  Diet:  Drink liquids today or eat a light diet.  You may resume a regular diet tomorrow.    General expectations: Pain for two to three days. Fingers may become slightly swollen.  Call your doctor if any of the following occur: Severe pain not relieved by pain medication. Elevated temperature. Dressing soaked with blood. Inability to move fingers. White or bluish color to fingers.

## 2023-09-11 NOTE — Anesthesia Preprocedure Evaluation (Addendum)
 Anesthesia Evaluation  Patient identified by MRN, date of birth, ID band Patient awake    Reviewed: Allergy & Precautions, NPO status , Patient's Chart, lab work & pertinent test results  Airway Mallampati: II  TM Distance: <3 FB Neck ROM: Full    Dental  (+) Teeth Intact, Dental Advisory Given, Chipped,    Pulmonary sleep apnea    Pulmonary exam normal breath sounds clear to auscultation       Cardiovascular hypertension, Pt. on medications + Valvular Problems/Murmurs AS  Rhythm:Regular Rate:Normal + Systolic murmurs Echo 06/08/18 Study Conclusions  - Left ventricle: The cavity size was mildly dilated. Systolicfunction was normal. The estimated ejection fraction was in therange of 55% to 60%. Wall motion was normal; there were noregional wall motion abnormalities. Doppler parameters areconsistent with abnormal left ventricular relaxation (grade 1diastolic dysfunction). - Aortic valve: Moderately calcified annulus. Trileaflet;moderately thickened, moderately calcified leaflets. Valvemobility was restricted. Peak velocity (S): 216 cm/s. Meangradient (S): 9 mm Hg. Valve area (Vmax): 1.97 cm^2. - Mitral valve: Moderately calcified annulus. There was mildregurgitation.   Neuro/Psych negative neurological ROS  negative psych ROS   GI/Hepatic ,GERD  Medicated,,Biliary colic    Endo/Other  Hypothyroidism  Obesity   Renal/GU negative Renal ROS     Musculoskeletal negative musculoskeletal ROS (+)    Abdominal  (+) + obese  Peds  Hematology negative hematology ROS (+)   Anesthesia Other Findings   Reproductive/Obstetrics negative OB ROS                             Anesthesia Physical Anesthesia Plan  ASA: 3  Anesthesia Plan: General   Post-op Pain Management: Minimal or no pain anticipated   Induction: Intravenous  PONV Risk Score and Plan: 3 and Dexamethasone, Ondansetron,  Treatment may vary due to age or medical condition and Midazolam  Airway Management Planned: LMA  Additional Equipment: None  Intra-op Plan:   Post-operative Plan: Extubation in OR  Informed Consent: I have reviewed the patients History and Physical, chart, labs and discussed the procedure including the risks, benefits and alternatives for the proposed anesthesia with the patient or authorized representative who has indicated his/her understanding and acceptance.     Dental advisory given  Plan Discussed with: CRNA and Anesthesiologist  Anesthesia Plan Comments:         Anesthesia Quick Evaluation

## 2023-09-11 NOTE — Transfer of Care (Signed)
 Immediate Anesthesia Transfer of Care Note  Patient: Stephine Langbehn Beste  Procedure(s) Performed: RIGHT CARPAL TUNNEL RELEASE (Right: Wrist)  Patient Location: PACU  Anesthesia Type:General  Level of Consciousness: drowsy, patient cooperative, and responds to stimulation  Airway & Oxygen Therapy: Patient Spontanous Breathing and Patient connected to face mask oxygen  Post-op Assessment: Report given to RN and Post -op Vital signs reviewed and stable  Post vital signs: Reviewed and stable  Last Vitals:  Vitals Value Taken Time  BP 139/78 09/11/23 1352  Temp    Pulse 69 09/11/23 1353  Resp 14 09/11/23 1353  SpO2 98 % 09/11/23 1353  Vitals shown include unfiled device data.  Last Pain:  Vitals:   09/11/23 1114  TempSrc: Temporal  PainSc: 0-No pain      Patients Stated Pain Goal: 3 (09/11/23 1114)  Complications: No notable events documented.

## 2023-09-11 NOTE — Op Note (Signed)
 09/11/2023 [facility]                              OPERATIVE REPORT   PREOPERATIVE DIAGNOSIS:  Right carpal tunnel syndrome  POSTOPERATIVE DIAGNOSIS:  Right carpal tunnel syndrome  PROCEDURE:  Right carpal tunnel release  SURGEON:  Betha Loa, MD  ASSISTANT:  none.  ANESTHESIA: General  IV FLUIDS:  Per anesthesia flow sheet  ESTIMATED BLOOD LOSS:  Minimal  COMPLICATIONS:  None  SPECIMENS:  None  TOURNIQUET TIME:    Total Tourniquet Time Documented: Forearm (Right) - 13 minutes Total: Forearm (Right) - 13 minutes   DISPOSITION:  Stable to PACU  LOCATION: [facility]  INDICATIONS:  78 y.o. yo female with numbness and tingling right hand.  Nocturnal symptoms. Positive nerve conduction studies. She wishes to proceed with right carpal tunnel release.  Risks, benefits and alternatives of surgery were discussed including the risk of blood loss; infection; damage to nerves, vessels, tendons, ligaments, bone; failure of surgery; need for additional surgery; complications with wound healing; continued pain; recurrence of carpal tunnel syndrome; and damage to motor branch. She voiced understanding of these risks and elected to proceed.   OPERATIVE COURSE:  After being identified preoperatively by myself, the patient and I agreed upon the procedure and site of procedure.  The surgical site was marked.  Surgical consent had been signed.  She was given IV Ancef as preoperative antibiotic prophylaxis.  She was transferred to the operating room and placed on the operating room table in supine position with the Right upper extremity on an armboard.  General anesthesia was induced by the anesthesiologist.  Right upper extremity was prepped and draped in normal sterile orthopaedic fashion.  A surgical pause was performed between the surgeons, anesthesia, and operating room staff, and all were in agreement as to the patient, procedure, and site of procedure.   Tourniquet at the proximal aspect of the forearm was inflated to 250 mmHg after exsanguination of the arm with an Esmarch bandage  Incision was made over the transverse carpal ligament and carried into the subcutaneous tissues by spreading technique.  Bipolar electrocautery was used to obtain hemostasis.  The palmar fascia was sharply incised.  The transverse carpal ligament was identified.  The fascia distal to the ligament was opened.  Retractor was placed and the flexor tendons were identified.  The flexor tendon to the ring finger was identified and retracted radially.  The transverse carpal ligament was then incised from distal to proximal under direct visualization.  Scissors were used to split the distal aspect of the volar antebrachial fascia.  A finger was placed into the wound to ensure complete decompression, which was the case.  The nerve was examined.  It was flattened and hyperemic. It was adherent to the radial leaflet.  The motor branch was identified and was intact.  The wound was copiously irrigated with sterile saline.  It was then closed with 4-0 nylon in a horizontal mattress fashion.  It was injected with 0.25% plain Marcaine to aid in postoperative analgesia.  It was dressed with sterile Xeroform, 4x4s, an ABD, and wrapped with Kerlix and an Ace bandage.  Tourniquet was deflated at 13 minutes.  Fingertips were pink with brisk capillary refill after deflation of the tourniquet.  Operative drapes were broken down.  The patient was awoken from anesthesia safely.  She was transferred back to stretcher and taken to the  PACU in stable condition.  I will see her back in the office in 1 week for postoperative followup.  She states she has pain medication left over from her last procedure.    Betha Loa, MD Electronically signed, 09/11/23

## 2023-09-11 NOTE — H&P (Signed)
 Tina Elliott is an 78 y.o. female.   Chief Complaint: carpal tunnel syndrome HPI: 78 y.o. yo female with numbness and tingling right hand.  Nocturnal symptoms. Positive nerve conduction studies. She wishes to have right carpal tunnel release.   Allergies:  Allergies  Allergen Reactions   Wheat Anaphylaxis   Celebrex [Celecoxib] Other (See Comments)    GI issues   Gluten Meal Other (See Comments)    Celiac disease    Past Medical History:  Diagnosis Date   Acquired dilation of ascending aorta and aortic root (HCC)    borderline at 38mm by echo 07/2020   Aortic stenosis    mild with mean aortic valve gradient by echo 08/2023   Arthritis    right knee, hands   Celiac disease    Diastolic dysfunction    GERD (gastroesophageal reflux disease)    Hyperlipidemia    Hypertension    Hypothyroidism    OSA (obstructive sleep apnea)    mild with AHI 9.58/hr on CPAP at 7cm H2O    Past Surgical History:  Procedure Laterality Date   CARPAL TUNNEL RELEASE Left 04/10/2023   Procedure: LEFT CARPAL TUNNEL RELEASE;  Surgeon: Betha Loa, MD;  Location: [facility];  Service: Orthopedics;  Laterality: Left;   left knee sx     TRIGGER FINGER RELEASE Left 2017    Family History: Family History  Problem Relation Age of Onset   Heart attack Mother    Heart attack Father    AAA (abdominal aortic aneurysm) Brother    Breast cancer Neg Hx     Social History:   reports that she has never smoked. She has never used smokeless tobacco. She reports that she does not drink alcohol and does not use drugs.  Medications: Medications Prior to Admission  Medication Sig Dispense Refill   ascorbic acid (VITAMIN C) 1000 MG tablet Take 1,000 mg by mouth daily.     aspirin EC 81 MG tablet Take 81 mg by mouth at bedtime.     cholecalciferol (VITAMIN D) 1000 UNITS tablet Take 1,000 Units by mouth daily.     diclofenac Sodium (VOLTAREN) 1 % GEL Apply 4 g topically 4 (four) times  daily as needed (pain). 4 g 0   famotidine (PEPCID) 20 MG tablet Take 20 mg by mouth daily.     ibuprofen (ADVIL) 600 MG tablet Take 1 tablet (600 mg total) by mouth every 6 (six) hours as needed. 30 tablet 0   levothyroxine (SYNTHROID) 112 MCG tablet Take 112 mcg by mouth every morning.     lisinopril-hydrochlorothiazide (PRINZIDE,ZESTORETIC) 10-12.5 MG per tablet Take 1 tablet by mouth daily.     loratadine (CLARITIN) 10 MG tablet Take 10 mg by mouth daily.     NON FORMULARY CPAP MACHINE     Omega-3 Fatty Acids (FISH OIL) 1000 MG CAPS Take 1,000 mg by mouth daily.     pravastatin (PRAVACHOL) 40 MG tablet Take 40 mg by mouth daily.     sodium chloride (OCEAN) 0.65 % SOLN nasal spray Place 1 spray into both nostrils as needed for congestion.     Verapamil HCl CR 300 MG CP24 Take 300 mg by mouth daily.       No results found for this or any previous visit (from the past 48 hours).  No results found.    Blood pressure (!) 151/66, pulse 81, temperature 98 F (36.7 C), temperature source Temporal, resp. rate 16, height 5' 6.5" (1.689  m), weight 105 kg, SpO2 98%.  General appearance: alert, cooperative, and appears stated age Head: Normocephalic, without obvious abnormality, atraumatic Neck: supple, symmetrical, trachea midline Extremities: Intact capillary refill all digits.  +epl/fpl/io.  No wounds.  Skin: Skin color, texture, turgor normal. No rashes or lesions Neurologic: Grossly normal Incision/Wound: none  Assessment/Plan Right carpal tunnel syndrome.  Non operative and operative treatment options have been discussed with the patient and patient wishes to proceed with operative treatment. Risks, benefits, and alternatives of surgery have been discussed and the patient agrees with the plan of care.   Betha Loa 09/11/2023, 1:05 PM

## 2023-09-11 NOTE — Anesthesia Procedure Notes (Signed)
 Procedure Name: LMA Insertion Date/Time: 09/11/2023 1:21 PM  Performed by: Thornell Mule, CRNAPre-anesthesia Checklist: Patient identified, Emergency Drugs available, Suction available and Patient being monitored Patient Re-evaluated:Patient Re-evaluated prior to induction Oxygen Delivery Method: Circle system utilized Preoxygenation: Pre-oxygenation with 100% oxygen Induction Type: IV induction LMA: LMA inserted LMA Size: 4.0 Number of attempts: 1 Placement Confirmation: CO2 detector Tube secured with: Tape Dental Injury: Teeth and Oropharynx as per pre-operative assessment

## 2023-09-12 ENCOUNTER — Encounter (HOSPITAL_BASED_OUTPATIENT_CLINIC_OR_DEPARTMENT_OTHER): Payer: Self-pay | Admitting: Orthopedic Surgery

## 2023-09-13 NOTE — Anesthesia Postprocedure Evaluation (Signed)
 Anesthesia Post Note  Patient: Tina Elliott  Procedure(s) Performed: RIGHT CARPAL TUNNEL RELEASE (Right: Wrist)     Patient location during evaluation: PACU Anesthesia Type: General Level of consciousness: awake and alert Pain management: pain level controlled Vital Signs Assessment: post-procedure vital signs reviewed and stable Respiratory status: spontaneous breathing, nonlabored ventilation, respiratory function stable and patient connected to nasal cannula oxygen Cardiovascular status: blood pressure returned to baseline and stable Postop Assessment: no apparent nausea or vomiting Anesthetic complications: no   No notable events documented.  Last Vitals:  Vitals:   09/11/23 1430 09/11/23 1443  BP: (!) 144/70 (!) 144/75  Pulse: 64 70  Resp: 16 16  Temp:  (!) 36.2 C  SpO2: 96% 94%    Last Pain:  Vitals:   09/12/23 0947  TempSrc:   PainSc: 0-No pain                 Kennieth Rad

## 2023-10-02 ENCOUNTER — Ambulatory Visit
Admission: RE | Admit: 2023-10-02 | Discharge: 2023-10-02 | Disposition: A | Discharge status: Home / Self Care | Payer: Medicare Other | Source: Ambulatory Visit | Attending: Family Medicine | Admitting: Family Medicine

## 2023-10-02 DIAGNOSIS — Z1231 Encounter for screening mammogram for malignant neoplasm of breast: Secondary | ICD-10-CM

## 2024-01-31 LAB — LAB REPORT - SCANNED
A1c: 5.7
EGFR: 52

## 2024-07-16 ENCOUNTER — Ambulatory Visit: Attending: Cardiology | Admitting: Cardiology

## 2024-07-16 ENCOUNTER — Encounter: Payer: Self-pay | Admitting: Cardiology

## 2024-07-16 ENCOUNTER — Telehealth: Payer: Self-pay

## 2024-07-16 VITALS — BP 126/68 | HR 78 | Ht 66.5 in | Wt 246.0 lb

## 2024-07-16 DIAGNOSIS — I350 Nonrheumatic aortic (valve) stenosis: Secondary | ICD-10-CM

## 2024-07-16 DIAGNOSIS — G4733 Obstructive sleep apnea (adult) (pediatric): Secondary | ICD-10-CM | POA: Diagnosis not present

## 2024-07-16 DIAGNOSIS — I10 Essential (primary) hypertension: Secondary | ICD-10-CM | POA: Diagnosis not present

## 2024-07-16 DIAGNOSIS — R0989 Other specified symptoms and signs involving the circulatory and respiratory systems: Secondary | ICD-10-CM | POA: Diagnosis not present

## 2024-07-16 NOTE — Telephone Encounter (Signed)
 Tina Elliott

## 2024-07-16 NOTE — Addendum Note (Signed)
 Addended by: JANIT GENI CROME on: 07/16/2024 11:22 AM   Modules accepted: Orders

## 2024-07-16 NOTE — Progress Notes (Signed)
 "  Date:  07/16/2024   ID:  Tina Elliott, DOB 1946/01/21, MRN [id number]  PCP:  Tina Artist PARAS, MD  Sleep medicine:  Wilbert Bihari, MD  Electrophysiologist:  None   Chief Complaint:  OSA  History of Present Illness:    Tina Elliott is a 79 y.o. female with a hx of HTN, mild AS and mild OSA on CPAP at 7cm H2O.    She is here today for followup.  She denies any Chest pain or pressure, DOE, SOB, LE edema, dizziness, syncope or palpitations.  She is doing well with her PAP device and thinks that she has gotten used to it.  She tolerates the nasal mask and feels the pressure is adequate.  Since going on PAP she feels rested in the am and has no significant daytime sleepiness but may nap in the evening.  She denies any significant mouth or nasal dryness or nasal congestion.  She does not think that she snores. An Epworth Sleepiness Scale score was calculated the office today and this endorsed at 2 arguing against residual daytime sleepiness. Patient denies any episodes of bruxism, restless legs, No hypnogognic hallucinations or cataplectic events.    Prior CV studies:   The following studies were reviewed today:  PAP compliance download  Past Medical History:  Diagnosis Date   Acquired dilation of ascending aorta and aortic root    borderline at 38mm by echo 07/2020   Aortic stenosis    mild with mean aortic valve gradient by echo 08/2023   Arthritis    right knee, hands   Celiac disease    Diastolic dysfunction    GERD (gastroesophageal reflux disease)    Hyperlipidemia    Hypertension    Hypothyroidism    OSA (obstructive sleep apnea)    mild with AHI 9.58/hr on CPAP at 7cm H2O   Past Surgical History:  Procedure Laterality Date   CARPAL TUNNEL RELEASE Left 04/10/2023   Procedure: LEFT CARPAL TUNNEL RELEASE;  Surgeon: Murrell Drivers, MD;  Location: [facility];  Service: Orthopedics;  Laterality: Left;   CARPAL TUNNEL RELEASE Right 09/11/2023    Procedure: RIGHT CARPAL TUNNEL RELEASE;  Surgeon: Murrell Drivers, MD;  Location: [facility];  Service: Orthopedics;  Laterality: Right;  30 MIN   left knee sx     TRIGGER FINGER RELEASE Left 2017     Current Meds  Medication Sig   ascorbic acid (VITAMIN C) 1000 MG tablet Take 1,000 mg by mouth daily.   aspirin EC 81 MG tablet Take 81 mg by mouth at bedtime.   Cetirizine HCl (ZYRTEC ALLERGY PO) Take 1 tablet by mouth daily.   cholecalciferol (VITAMIN D) 1000 UNITS tablet Take 1,000 Units by mouth daily.   diclofenac  Sodium (VOLTAREN ) 1 % GEL Apply 4 g topically 4 (four) times daily as needed (pain).   famotidine (PEPCID) 20 MG tablet Take 20 mg by mouth daily.   ibuprofen  (ADVIL ) 600 MG tablet Take 1 tablet (600 mg total) by mouth every 6 (six) hours as needed.   levothyroxine (SYNTHROID) 112 MCG tablet Take 112 mcg by mouth every morning.   lisinopril-hydrochlorothiazide (PRINZIDE,ZESTORETIC) 10-12.5 MG per tablet Take 1 tablet by mouth daily.   NON FORMULARY CPAP MACHINE   Omega-3 Fatty Acids (FISH OIL) 1000 MG CAPS Take 1,000 mg by mouth daily.   pravastatin (PRAVACHOL) 40 MG tablet Take 40 mg by mouth daily.   sodium chloride  (OCEAN) 0.65 % SOLN nasal spray  Place 1 spray into both nostrils as needed for congestion.   Verapamil HCl CR 300 MG CP24 Take 300 mg by mouth daily.      Allergies:   Wheat, Celebrex [celecoxib], and Gluten meal   Social History   Tobacco Use   Smoking status: Never   Smokeless tobacco: Never  Vaping Use   Vaping status: Never Used  Substance Use Topics   Alcohol use: No    Alcohol/week: 0.0 standard drinks of alcohol   Drug use: No     Family Hx: The patient's family history includes AAA (abdominal aortic aneurysm) in her brother; Heart attack in her father and mother. There is no history of Breast cancer.  ROS:   Please see the history of present illness.     All other systems reviewed and are negative.   Labs/Other Tests and  Data Reviewed:    Recent Labs: 09/06/2023: BUN 16; Creatinine, Ser 1.07; Potassium 4.0; Sodium 138   Recent Lipid Panel No results found for: CHOL, TRIG, HDL, CHOLHDL, LDLCALC, LDLDIRECT  Wt Readings from Last 3 Encounters:  07/16/24 246 lb (111.6 kg)  09/11/23 231 lb 7.7 oz (105 kg)  04/10/23 236 lb 15.9 oz (107.5 kg)    EKG was not performed today   Objective:    Vital Signs:  BP 126/68   Pulse 78   Ht 5' 6.5 (1.689 m)   Wt 246 lb (111.6 kg)   SpO2 94%   BMI 39.11 kg/m   GEN: Well nourished, well developed in no acute distress HEENT: Normal NECK: No JVD; bilateral carotid artery bruits LYMPHATICS: No lymphadenopathy CARDIAC:RRR, no rubs, gallops  2/6 early peaking SM at RUSB to RUSB RESPIRATORY:  Clear to auscultation without rales, wheezing or rhonchi  ABDOMEN: Soft, non-tender, non-distended MUSCULOSKELETAL:  No edema; No deformity  SKIN: Warm and dry NEUROLOGIC:  Alert and oriented x 3 PSYCHIATRIC:  Normal affect  ASSESSMENT & PLAN:    OSA - The patient is tolerating PAP therapy well without any problems. The PAP download performed by his DME was personally reviewed and interpreted by me today and showed an AHI of 0.2 /hr on 7 cm H2O with 100% compliance in using more than 4 hours nightly.  The patient has been using and benefiting from PAP use and will continue to benefit from therapy.   HTN -BP controlled on exam today at 126/60 mmHg -Continue Zestoretic 10/12.5 mg daily and verapamil 300 mg daily with as needed refills -I will get a copy of her labs from her PCP to review  Aortic stenosis -2D echo 08/05/2020 showed mild aortic stenosis with mean aortic valve gradient 10 mmHg -Repeat 2D echo 08/23/2022 showed EF 60 to 65% with mild LVH, grade 1 diastolic dysfunction, mild MR and mild aortic valve stenosis with mean gradient 12.6 mmHg, V-max 2.34 m/s and AVA by VTI 1.56 cm -Repeat 2D echo 08/24/2023: EF 60 to 65%, normal LV strain, moderate MAC, mild AS  with AVA 1.98 cm by VTI and mean aortic valve gradient 11 mmHg V-max 2.73m/s -Repeat echo in February 2026  Bilateral carotid artery bruits -cannot discern whether this is a murmur radiating from her AV vs. Primary carotid stenosis -will check carotid dopplers  Medication Adjustments/Labs and Tests Ordered: Current medicines are reviewed at length with the patient today.  Concerns regarding medicines are outlined above.  Tests Ordered: No orders of the defined types were placed in this encounter.  Medication Changes: No orders of the defined types were  placed in this encounter.   Disposition:  Follow up in 1 year(s)  Signed, Wilbert Bihari, MD  07/16/2024 10:53 AM    [facility] "

## 2024-07-16 NOTE — Patient Instructions (Signed)
 Medication Instructions:  Your physician recommends that you continue on your current medications as directed. Please refer to the Current Medication list given to you today.  *If you need a refill on your cardiac medications before your next appointment, please call your pharmacy*  Lab Work: Please have your PCP fax your recent lab work to us  at [phone].  If you have labs (blood work) drawn today and your tests are completely normal, you will receive your results only by: MyChart Message (if you have MyChart) OR A paper copy in the mail If you have any lab test that is abnormal or we need to change your treatment, we will call you to review the results.  Testing/Procedures: Your physician has requested that you have an echocardiogram. Echocardiography is a painless test that uses sound waves to create images of your heart. It provides your doctor with information about the size and shape of your heart and how well your hearts chambers and valves are working. This procedure takes approximately one hour. There are no restrictions for this procedure. Please do NOT wear cologne, perfume, aftershave, or lotions (deodorant is allowed). Please arrive 15 minutes prior to your appointment time.  Please note: We ask at that you not bring children with you during ultrasound (echo/ vascular) testing. Due to room size and safety concerns, children are not allowed in the ultrasound rooms during exams. Our front office staff cannot provide observation of children in our lobby area while testing is being conducted. An adult accompanying a patient to their appointment will only be allowed in the ultrasound room at the discretion of the ultrasound technician under special circumstances. We apologize for any inconvenience.  Your physician has requested that you have a carotid duplex. This test is an ultrasound of the carotid arteries in your neck. It looks at blood flow through these arteries that supply the  brain with blood. Allow one hour for this exam. There are no restrictions or special instructions.   Follow-Up: At [facility], you and your health needs are our priority.  As part of our continuing mission to provide you with exceptional heart care, our providers are all part of one team.  This team includes your primary Cardiologist (physician) and Advanced Practice Providers or APPs (Physician Assistants and Nurse Practitioners) who all work together to provide you with the care you need, when you need it.  Your next appointment:   1 year(s)  Provider:   Wilbert Bihari, MD

## 2024-08-19 ENCOUNTER — Ambulatory Visit (HOSPITAL_COMMUNITY)

## 2024-08-19 ENCOUNTER — Ambulatory Visit (HOSPITAL_COMMUNITY): Admission: RE | Admit: 2024-08-19 | Source: Ambulatory Visit
